# Patient Record
Sex: Male | Born: 1955 | Race: White | Hispanic: No | Marital: Married | Smoking: Never smoker
Health system: Southern US, Community
[De-identification: ages and names within clinical notes are randomized; demographics above are authoritative.]

## PROBLEM LIST (undated history)

## (undated) DIAGNOSIS — Z860100 Personal history of colon polyps, unspecified: Secondary | ICD-10-CM

## (undated) DIAGNOSIS — E119 Type 2 diabetes mellitus without complications: Secondary | ICD-10-CM

## (undated) DIAGNOSIS — I10 Essential (primary) hypertension: Secondary | ICD-10-CM

## (undated) DIAGNOSIS — I4510 Unspecified right bundle-branch block: Secondary | ICD-10-CM

## (undated) DIAGNOSIS — R7989 Other specified abnormal findings of blood chemistry: Secondary | ICD-10-CM

## (undated) DIAGNOSIS — M1990 Unspecified osteoarthritis, unspecified site: Secondary | ICD-10-CM

## (undated) DIAGNOSIS — E785 Hyperlipidemia, unspecified: Secondary | ICD-10-CM

## (undated) DIAGNOSIS — G4730 Sleep apnea, unspecified: Secondary | ICD-10-CM

## (undated) DIAGNOSIS — K5790 Diverticulosis of intestine, part unspecified, without perforation or abscess without bleeding: Secondary | ICD-10-CM

## (undated) DIAGNOSIS — M2550 Pain in unspecified joint: Secondary | ICD-10-CM

## (undated) HISTORY — DX: Other specified abnormal findings of blood chemistry: R79.89

## (undated) HISTORY — PX: COLONOSCOPY: SHX174

## (undated) HISTORY — DX: Type 2 diabetes mellitus without complications: E11.9

## (undated) HISTORY — DX: Unspecified right bundle-branch block: I45.10

---

## 1981-07-16 HISTORY — PX: SHOULDER ARTHROSCOPY: SHX128

## 1998-12-05 ENCOUNTER — Ambulatory Visit (HOSPITAL_COMMUNITY): Admission: RE | Admit: 1998-12-05 | Discharge: 1998-12-05 | Payer: Self-pay | Admitting: Gastroenterology

## 2004-06-19 ENCOUNTER — Ambulatory Visit (HOSPITAL_BASED_OUTPATIENT_CLINIC_OR_DEPARTMENT_OTHER): Admission: RE | Admit: 2004-06-19 | Discharge: 2004-06-19 | Payer: Self-pay | Admitting: Family Medicine

## 2004-08-03 ENCOUNTER — Ambulatory Visit (HOSPITAL_COMMUNITY): Admission: RE | Admit: 2004-08-03 | Discharge: 2004-08-03 | Payer: Self-pay | Admitting: Gastroenterology

## 2012-01-05 ENCOUNTER — Emergency Department (HOSPITAL_COMMUNITY): Payer: BC Managed Care – PPO

## 2012-01-05 ENCOUNTER — Emergency Department (HOSPITAL_COMMUNITY)
Admission: EM | Admit: 2012-01-05 | Discharge: 2012-01-05 | Disposition: A | Discharge status: Home / Self Care | Payer: BC Managed Care – PPO | Attending: Emergency Medicine | Admitting: Emergency Medicine

## 2012-01-05 ENCOUNTER — Encounter (HOSPITAL_COMMUNITY): Payer: Self-pay | Admitting: Physical Medicine and Rehabilitation

## 2012-01-05 DIAGNOSIS — Z7982 Long term (current) use of aspirin: Secondary | ICD-10-CM | POA: Insufficient documentation

## 2012-01-05 DIAGNOSIS — S61209A Unspecified open wound of unspecified finger without damage to nail, initial encounter: Secondary | ICD-10-CM | POA: Insufficient documentation

## 2012-01-05 DIAGNOSIS — S61218A Laceration without foreign body of other finger without damage to nail, initial encounter: Secondary | ICD-10-CM

## 2012-01-05 DIAGNOSIS — W268XXA Contact with other sharp object(s), not elsewhere classified, initial encounter: Secondary | ICD-10-CM | POA: Insufficient documentation

## 2012-01-05 DIAGNOSIS — E785 Hyperlipidemia, unspecified: Secondary | ICD-10-CM | POA: Insufficient documentation

## 2012-01-05 HISTORY — DX: Hyperlipidemia, unspecified: E78.5

## 2012-01-05 MED ORDER — LIDOCAINE HCL 2 % IJ SOLN
INTRAMUSCULAR | Status: AC
  Filled 2012-01-05: qty 1

## 2012-01-05 NOTE — ED Notes (Signed)
Pt presents to department for evaluation of R 1st finger laceration. States he cut his finger on mug this afternoon. 2cm laceration noted, bleeding controlled. States numbness/tingling to tip of finger. No other injuries noted. He is alert and oriented x4. No signs of acute distress noted.

## 2012-01-05 NOTE — Discharge Instructions (Signed)
You were seen and evaluated for your laceration of your finger. Your wound was cleaned and closed with sutures. Given given instructions to followup with orthopedic hand specialist, Dr. Merlyn Lot on Monday. Please call his office to schedule close followup appointment. Keep your wound clean and dry. Return if you develop any persistent bleeding or increased pain.   Laceration Care, Adult A laceration is a cut or lesion that goes through all layers of the skin and into the tissue just beneath the skin. TREATMENT  Some lacerations may not require closure. Some lacerations may not be able to be closed due to an increased risk of infection. It is important to see your caregiver as soon as possible after an injury to minimize the risk of infection and maximize the opportunity for successful closure. If closure is appropriate, pain medicines may be given, if needed. The wound will be cleaned to help prevent infection. Your caregiver will use stitches (sutures), staples, wound glue (adhesive), or skin adhesive strips to repair the laceration. These tools bring the skin edges together to allow for faster healing and a better cosmetic outcome. However, all wounds will heal with a scar. Once the wound has healed, scarring can be minimized by covering the wound with sunscreen during the day for 1 full year. HOME CARE INSTRUCTIONS  For sutures or staples:  Keep the wound clean and dry.   If you were given a bandage (dressing), you should change it at least once a day. Also, change the dressing if it becomes wet or dirty, or as directed by your caregiver.   Wash the wound with soap and water 2 times a day. Rinse the wound off with water to remove all soap. Pat the wound dry with a clean towel.   After cleaning, apply a thin layer of the antibiotic ointment as recommended by your caregiver. This will help prevent infection and keep the dressing from sticking.   You may shower as usual after the first 24 hours. Do  not soak the wound in water until the sutures are removed.   Only take over-the-counter or prescription medicines for pain, discomfort, or fever as directed by your caregiver.   Get your sutures or staples removed as directed by your caregiver.  For skin adhesive strips:  Keep the wound clean and dry.   Do not get the skin adhesive strips wet. You may bathe carefully, using caution to keep the wound dry.   If the wound gets wet, pat it dry with a clean towel.   Skin adhesive strips will fall off on their own. You may trim the strips as the wound heals. Do not remove skin adhesive strips that are still stuck to the wound. They will fall off in time.  For wound adhesive:  You may briefly wet your wound in the shower or bath. Do not soak or scrub the wound. Do not swim. Avoid periods of heavy perspiration until the skin adhesive has fallen off on its own. After showering or bathing, gently pat the wound dry with a clean towel.   Do not apply liquid medicine, cream medicine, or ointment medicine to your wound while the skin adhesive is in place. This may loosen the film before your wound is healed.   If a dressing is placed over the wound, be careful not to apply tape directly over the skin adhesive. This may cause the adhesive to be pulled off before the wound is healed.   Avoid prolonged exposure to sunlight or  tanning lamps while the skin adhesive is in place. Exposure to ultraviolet light in the first year will darken the scar.   The skin adhesive will usually remain in place for 5 to 10 days, then naturally fall off the skin. Do not pick at the adhesive film.  You may need a tetanus shot if:  You cannot remember when you had your last tetanus shot.   You have never had a tetanus shot.  If you get a tetanus shot, your arm may swell, get red, and feel warm to the touch. This is common and not a problem. If you need a tetanus shot and you choose not to have one, there is a rare chance of  getting tetanus. Sickness from tetanus can be serious. SEEK MEDICAL CARE IF:   You have redness, swelling, or increasing pain in the wound.   You see a red line that goes away from the wound.   You have yellowish-white fluid (pus) coming from the wound.   You have a fever.   You notice a bad smell coming from the wound or dressing.   Your wound breaks open before or after sutures have been removed.   You notice something coming out of the wound such as wood or glass.   Your wound is on your hand or foot and you cannot move a finger or toe.  SEEK IMMEDIATE MEDICAL CARE IF:   Your pain is not controlled with prescribed medicine.   You have severe swelling around the wound causing pain and numbness or a change in color in your arm, hand, leg, or foot.   Your wound splits open and starts bleeding.   You have worsening numbness, weakness, or loss of function of any joint around or beyond the wound.   You develop painful lumps near the wound or on the skin anywhere on your body.  MAKE SURE YOU:   Understand these instructions.   Will watch your condition.   Will get help right away if you are not doing well or get worse.  Document Released: 07/02/2005 Document Revised: 06/21/2011 Document Reviewed: 12/26/2010 [facility] Patient Information 2012 [place], [place].   RESOURCE GUIDE  Chronic Pain Problems: Contact [facility]  [phone] Patients need to be referred by their primary care doctor.  Insufficient Money for Medicine: Contact United Way:  call "211" or [facility] [phone].  No Primary Care Doctor: - Call Health Connect  [phone] - can help you locate a primary care doctor that  accepts your insurance, provides certain services, etc. - Physician Referral [phone]  Agencies that provide inexpensive medical care: - [facility]  [phone] - [facility]  [phone] - [facility]  [phone] [facility]  [phone] - [facility]  [phone] [facility]  [phone]  Medicaid-accepting [facility] Providers: - [facility]- [address]  [phone], Mon-Fri 9am-7pm, Sat 9am-1pm - [facility]- [address] [place], [address] [place]  [phone] - [facility]- [address] [place]  [phone] [facility]- [address]  [phone] - Renaye Rakers- [address] [place], [address], [phone]  Only accepts [place] patients after they have their name  applied to their card  Self Pay (no insurance) in [place]: - Sickle Cell Patients: Dr Willey Blade, [facility]  [address] [place],  [phone] - [facility]- [address] [place]  [phone]       Patrcia Dolly [facility] [place]- [address]       -     [facility]- see information above (Speak to [employer] if you do not have insurance)       -  [facility]- [address] [place], [phone]       -  [facility]- [address],  [phone]       -  [facility]- [address], [phone]       -  Dr Julio Sicks-  [address], [place], [phone]       -  [facility]- [address], [phone]       -  [facility]- [address], [phone], also [address], [phone]       -    [facility]- [address] [place], [phone], 1st & 3rd Saturday   every month, 10am-1pm  1) Find a Doctor and Pay Out of Pocket Although you won't have to find out who is covered by your insurance plan, it is a good idea to ask around and get recommendations. You will then need to call the office and see if the doctor you have chosen will accept you as a new patient and what types of options they offer for patients who are self-pay. Some doctors offer discounts or will  set up payment plans for their patients who do not have insurance, but you will need to ask so you aren't surprised when you get to your appointment.  2) Contact Your Local Health Department Not all health departments have doctors that can see patients for sick visits, but many do, so it is worth a call to see if yours does. If you don't know where your local health department is, you can check in your phone book. The CDC also has a tool to help you locate your state's health department, and many state websites also have listings of all of their local health departments.  3) Find a Walk-in Clinic If your illness is not likely to be very severe or complicated, you may want to try a walk in clinic. These are popping up all over the country in pharmacies, drugstores, and shopping centers. They're usually staffed by nurse practitioners or physician assistants that have been trained to treat common illnesses and complaints. They're usually fairly quick and inexpensive. However, if you have serious medical issues or chronic medical problems, these are probably not your best option  STD Testing - [facility] Department of [facility], STD Clinic, [address] [place], phone [phone] or [phone].  Monday - Friday, call for an appointment. [facility] Department of [employer], STD Clinic, [address], [place], phone [phone] or [phone].  Monday - Friday, call for an appointment.  Abuse/Neglect: [facility] Child Abuse Hotline 315-310-6410 [facility] Child Abuse Hotline (334)435-9509 (After Hours)  Emergency Shelter:  [facility] (919)343-4504  Maternity Homes: - [facility] [facility] 601-758-2106 - [facility] [phone]  MRSA Hotline #:   (817)673-3325  [facility] Resources  [facility]  United Way [facility] Dept. [address]  [address]         [address] [place] [address]  Blondell Reveal Phone:  [phone]                                  Phone:  [phone]                   Phone:  [phone]  [facility] Health, [phone] - [phone]       -     [facility] in [place], [address],                                  [phone], [facility] Child Abuse Hotline 9164042372 or [phone] (After Hours)   Behavioral Health Services  Substance Abuse Resources: - [facility] Drug Services  (732)416-4785 - [facility] [phone] - [facility] [phone] Floydene Flock 734-774-2067 - Residential & Outpatient Substance Abuse Program  [phone]  Psychological Services: [facility]  [phone] Services  [phone] - [facility], [phone] [place]. [address] [place], ACCESS LINE: (445) [phone] or [phone], [employer]  Dental Assistance  If unable to pay or uninsured, contact:  [facility] or [facility]. to become qualified for the adult dental clinic.  Patients with Medicaid: [facility] Dept [phone] [address], [phone] [address], [phone]  If unable to pay, or uninsured, contact [facility] [phone]) or [facility] Department [phone] in [place], [phone] in [facility]) to become qualified for the adult dental clinic  Other Low-Cost Community Dental Services: - [facility]- [address] [place], [place], [place], [address], [phone], Ext. 123, 2nd and 4th Thursday of the month at 6:30am.  10 clients each day by appointment, can sometimes see walk-in patients if someone does not show for an appointment. [facility]-  [address] [place], [place], [address], [phone] - [facility]- [address] [place], [place], [address], [phone] - [facility]- [phone] [facility]- [phone] [facility] Department- [phone]

## 2012-01-05 NOTE — ED Provider Notes (Signed)
History     CSN: [id number]  Arrival date & time 01/05/12  4098   First MD Initiated Contact with Patient 01/05/12 2003      Chief Complaint  Patient presents with  . Extremity Laceration    HPI  History provided by the patient. Patient is 56 year old male with history of hyperlipidemia who presents with complaints of laceration to right index finger. Patient reports having a broken porcelain month slice finger. Patient has had persistent bleeding has been difficult to control. Patient also has associated numbness to the tip of the finger. Patient denies any other injury. Patient denies any reduced range of motion or weakness in the finger. Symptoms are described as moderate. Patient is current on tetanus the last several months. Patient is right-hand dominant. They're no other aggravating or alleviating factors.   Past Medical History  Diagnosis Date  . Hyperlipemia     No past surgical history on file.  History reviewed. No pertinent family history.  History  Substance Use Topics  . Smoking status: Never Smoker   . Smokeless tobacco: Not on file  . Alcohol Use: No      Review of Systems  Skin:       Laceration  Neurological: Positive for numbness. Negative for dizziness and weakness.    Allergies  Review of patient's allergies indicates no known allergies.  Home Medications   Current Outpatient Rx  Name Route Sig Dispense Refill  . ASPIRIN EC 81 MG PO TBEC Oral Take 81 mg by mouth daily.    . OMEGA-3 FATTY ACIDS 1000 MG PO CAPS Oral Take 2 g by mouth daily.    . ADULT MULTIVITAMIN W/MINERALS CH Oral Take 1 tablet by mouth daily.    Marland Kitchen ROSUVASTATIN CALCIUM 10 MG PO TABS Oral Take 10 mg by mouth daily.    . TESTOSTERONE 50 MG/5GM TD GEL Transdermal Place 5 g onto the skin daily.      BP 147/86  Pulse 77  Temp 98.7 F (37.1 C) (Oral)  Resp 18  SpO2 98%  Physical Exam  Nursing note and vitals reviewed. Constitutional: He appears well-developed and  well-nourished. No distress.  HENT:  Head: Normocephalic.  Cardiovascular: Normal rate and regular rhythm.   Pulmonary/Chest: Effort normal and breath sounds normal.  Musculoskeletal:       1.5 cm laceration to the ulnar aspect of right index finger near the PIP. Normal range of motion and strength of the finger. Normal cap refill less than 2 seconds. She reports numbness to the lower aspect of the distal tip of finger. There is pulsatile bleeding from the laceration.  Neurological: He is alert.  Skin: Skin is warm.  Psychiatric: He has a normal mood and affect.    ED Course  Procedures  LACERATION REPAIR Performed by: Angus Seller Authorized by: Angus Seller Consent: Verbal consent obtained. Risks and benefits: risks, benefits and alternatives were discussed Consent given by: patient Patient identity confirmed: provided demographic data Prepped and Draped in normal sterile fashion Wound explored  Laceration Location: Medial right index finger  Laceration Length: 1.5 cm  No Foreign Bodies seen or palpated  Anesthesia: local infiltration  Local anesthetic: lidocaine 2 % without epinephrine  Anesthetic total: 5 ml  Irrigation method: syringe Amount of cleaning: standard  Skin closure: Skin with 3-0 Prolene   Number of sutures: 4   Technique: Simple interrupted   Patient tolerance: Patient tolerated the procedure well with no immediate complications.     Dg Finger Index  Right  01/05/2012  *RADIOLOGY REPORT*  Clinical Data: Glass mug shattered on right hand, with laceration at the right index finger.  RIGHT INDEX FINGER 2+V  Comparison: None.  Findings: There is no evidence of osseous disruption.  No radiopaque foreign bodies are identified.  The soft tissues are difficult to fully assess due to the surrounding dressing.  The known soft tissue laceration is not well characterized.  Visualized joint spaces are preserved.  The osseous structures appear intact.   IMPRESSION: No evidence of osseous disruption; no radiopaque foreign bodies seen.  Original Report Authenticated By: Tonia Ghent, M.D.     1. Laceration of finger, index       MDM  Patient seen and evaluated. Patient no acute distress.   Patient seen and discussed with attending physician. Will consult hand surgery.   Spoke with Dr. Merlyn Lot with hand. Bleeding is controlled at this point and he will see patient in followup on Monday. Would like wound closed with sutures.  Angus Seller, [place] 01/06/12 [phone]

## 2012-01-06 NOTE — ED Provider Notes (Signed)
Medical screening examination/treatment/procedure(s) were conducted as a shared visit with non-physician practitioner(s) and myself.  I personally evaluated the patient during the encounter  CR<2secs, witnessed arterial bleeding in ED controlled, suspect digital nerve lac, intact FDP/FDS/ext against resistance 5/5, lac ulnar aspect index near PIP.  Hurman Horn, MD 01/06/12 1247

## 2012-01-07 ENCOUNTER — Encounter (HOSPITAL_BASED_OUTPATIENT_CLINIC_OR_DEPARTMENT_OTHER): Payer: Self-pay | Admitting: *Deleted

## 2012-01-07 ENCOUNTER — Other Ambulatory Visit: Payer: Self-pay | Admitting: Orthopedic Surgery

## 2012-01-08 ENCOUNTER — Encounter (HOSPITAL_BASED_OUTPATIENT_CLINIC_OR_DEPARTMENT_OTHER)
Admission: RE | Disposition: A | Discharge status: Home / Self Care | Payer: Self-pay | Source: Ambulatory Visit | Attending: Orthopedic Surgery

## 2012-01-08 ENCOUNTER — Encounter (HOSPITAL_BASED_OUTPATIENT_CLINIC_OR_DEPARTMENT_OTHER): Payer: Self-pay | Admitting: Orthopedic Surgery

## 2012-01-08 ENCOUNTER — Encounter (HOSPITAL_BASED_OUTPATIENT_CLINIC_OR_DEPARTMENT_OTHER): Payer: Self-pay | Admitting: Anesthesiology

## 2012-01-08 ENCOUNTER — Ambulatory Visit (HOSPITAL_BASED_OUTPATIENT_CLINIC_OR_DEPARTMENT_OTHER)
Admission: RE | Admit: 2012-01-08 | Discharge: 2012-01-08 | Disposition: A | Discharge status: Home / Self Care | Payer: BC Managed Care – PPO | Source: Ambulatory Visit | Attending: Orthopedic Surgery | Admitting: Orthopedic Surgery

## 2012-01-08 ENCOUNTER — Ambulatory Visit (HOSPITAL_BASED_OUTPATIENT_CLINIC_OR_DEPARTMENT_OTHER): Payer: BC Managed Care – PPO | Admitting: Anesthesiology

## 2012-01-08 ENCOUNTER — Encounter (HOSPITAL_BASED_OUTPATIENT_CLINIC_OR_DEPARTMENT_OTHER): Payer: Self-pay | Admitting: Certified Registered"

## 2012-01-08 DIAGNOSIS — S65509A Unspecified injury of blood vessel of unspecified finger, initial encounter: Secondary | ICD-10-CM | POA: Insufficient documentation

## 2012-01-08 DIAGNOSIS — E785 Hyperlipidemia, unspecified: Secondary | ICD-10-CM | POA: Insufficient documentation

## 2012-01-08 DIAGNOSIS — G4730 Sleep apnea, unspecified: Secondary | ICD-10-CM | POA: Insufficient documentation

## 2012-01-08 DIAGNOSIS — W268XXA Contact with other sharp object(s), not elsewhere classified, initial encounter: Secondary | ICD-10-CM | POA: Insufficient documentation

## 2012-01-08 DIAGNOSIS — IMO0002 Reserved for concepts with insufficient information to code with codable children: Secondary | ICD-10-CM | POA: Insufficient documentation

## 2012-01-08 HISTORY — PX: NERVE AND ARTERY REPAIR: SHX5694

## 2012-01-08 SURGERY — NERVE AND ARTERY REPAIR
Anesthesia: General | Site: Finger | Laterality: Right | Wound class: Contaminated

## 2012-01-08 MED ORDER — CEFAZOLIN SODIUM-DEXTROSE 2-3 GM-% IV SOLR
2.0000 g | INTRAVENOUS | Status: AC
  Administered 2012-01-08: 2 g via INTRAVENOUS

## 2012-01-08 MED ORDER — SUCCINYLCHOLINE CHLORIDE 20 MG/ML IJ SOLN
INTRAMUSCULAR | Status: DC | PRN
  Administered 2012-01-08: 120 mg via INTRAVENOUS

## 2012-01-08 MED ORDER — LIDOCAINE HCL (CARDIAC) 20 MG/ML IV SOLN
INTRAVENOUS | Status: DC | PRN
  Administered 2012-01-08: 50 mg via INTRAVENOUS

## 2012-01-08 MED ORDER — CHLORHEXIDINE GLUCONATE 4 % EX LIQD: 60.0000 mL | Freq: Once | CUTANEOUS | Status: DC

## 2012-01-08 MED ORDER — LIDOCAINE HCL (PF) 1 % IJ SOLN: INTRAVENOUS | Status: DC | PRN

## 2012-01-08 MED ORDER — OXYCODONE HCL 5 MG PO TABS: 5.0000 mg | ORAL_TABLET | Freq: Once | ORAL | Status: DC | PRN

## 2012-01-08 MED ORDER — MIDAZOLAM HCL 2 MG/2ML IJ SOLN
0.5000 mg | INTRAMUSCULAR | Status: DC | PRN
  Administered 2012-01-08: 2 mg via INTRAVENOUS

## 2012-01-08 MED ORDER — EPHEDRINE SULFATE 50 MG/ML IJ SOLN
INTRAMUSCULAR | Status: DC | PRN
  Administered 2012-01-08: 10 mg via INTRAVENOUS

## 2012-01-08 MED ORDER — LACTATED RINGERS IV SOLN
INTRAVENOUS | Status: DC
  Administered 2012-01-08 (×2): via INTRAVENOUS

## 2012-01-08 MED ORDER — FENTANYL CITRATE 0.05 MG/ML IJ SOLN
50.0000 ug | INTRAMUSCULAR | Status: DC | PRN
  Administered 2012-01-08: 100 ug via INTRAVENOUS

## 2012-01-08 MED ORDER — METOCLOPRAMIDE HCL 5 MG/ML IJ SOLN: 10.0000 mg | Freq: Once | INTRAMUSCULAR | Status: DC | PRN

## 2012-01-08 MED ORDER — ONDANSETRON HCL 4 MG/2ML IJ SOLN
INTRAMUSCULAR | Status: DC | PRN
  Administered 2012-01-08: 4 mg via INTRAVENOUS

## 2012-01-08 MED ORDER — ROPIVACAINE HCL 5 MG/ML IJ SOLN
INTRAMUSCULAR | Status: DC | PRN
  Administered 2012-01-08: 30 mL via EPIDURAL

## 2012-01-08 MED ORDER — FENTANYL CITRATE 0.05 MG/ML IJ SOLN: 25.0000 ug | INTRAMUSCULAR | Status: DC | PRN

## 2012-01-08 MED ORDER — LIDOCAINE HCL 1 % IJ SOLN
INTRAMUSCULAR | Status: DC | PRN
  Administered 2012-01-08: 2 mL via INTRADERMAL

## 2012-01-08 MED ORDER — METOCLOPRAMIDE HCL 5 MG/ML IJ SOLN
INTRAMUSCULAR | Status: DC | PRN
  Administered 2012-01-08 (×2): 10 mg via INTRAVENOUS

## 2012-01-08 MED ORDER — HYDROCODONE-ACETAMINOPHEN 5-325 MG PO TABS: ORAL_TABLET | ORAL | Status: DC

## 2012-01-08 MED ORDER — DEXAMETHASONE SODIUM PHOSPHATE 4 MG/ML IJ SOLN
INTRAMUSCULAR | Status: DC | PRN
  Administered 2012-01-08: 8 mg via INTRAVENOUS

## 2012-01-08 MED ORDER — CEFAZOLIN SODIUM 1-5 GM-% IV SOLN: 1.0000 g | INTRAVENOUS | Status: DC

## 2012-01-08 MED ORDER — 0.9 % SODIUM CHLORIDE (POUR BTL) OPTIME
TOPICAL | Status: DC | PRN
  Administered 2012-01-08: 1000 mL

## 2012-01-08 MED ORDER — PROPOFOL 10 MG/ML IV EMUL
INTRAVENOUS | Status: DC | PRN
  Administered 2012-01-08: 250 mg via INTRAVENOUS

## 2012-01-08 SURGICAL SUPPLY — 63 items
BAG DECANTER FOR FLEXI CONT (MISCELLANEOUS) ×1 IMPLANT
BANDAGE CONFORM 2  STR LF (GAUZE/BANDAGES/DRESSINGS) ×1 IMPLANT
BANDAGE ELASTIC 3 VELCRO ST LF (GAUZE/BANDAGES/DRESSINGS) ×2 IMPLANT
BANDAGE GAUZE ELAST BULKY 4 IN (GAUZE/BANDAGES/DRESSINGS) ×2 IMPLANT
BLADE MINI RND TIP GREEN BEAV (BLADE) IMPLANT
BLADE SURG 15 STRL LF DISP TIS (BLADE) ×2 IMPLANT
BLADE SURG 15 STRL SS (BLADE) ×4
BNDG CMPR 9X4 STRL LF SNTH (GAUZE/BANDAGES/DRESSINGS) ×1
BNDG COHESIVE 1X5 TAN STRL LF (GAUZE/BANDAGES/DRESSINGS) ×1 IMPLANT
BNDG ESMARK 4X9 LF (GAUZE/BANDAGES/DRESSINGS) ×1 IMPLANT
CHLORAPREP W/TINT 26ML (MISCELLANEOUS) ×2 IMPLANT
CLOTH BEACON ORANGE TIMEOUT ST (SAFETY) ×2 IMPLANT
CORDS BIPOLAR (ELECTRODE) ×2 IMPLANT
COVER MAYO STAND STRL (DRAPES) ×2 IMPLANT
COVER TABLE BACK 60X90 (DRAPES) ×2 IMPLANT
CUFF TOURNIQUET SINGLE 18IN (TOURNIQUET CUFF) IMPLANT
CUFF TOURNIQUET SINGLE 24IN (TOURNIQUET CUFF) ×1 IMPLANT
DECANTER SPIKE VIAL GLASS SM (MISCELLANEOUS) ×1 IMPLANT
DRAPE EXTREMITY T 121X128X90 (DRAPE) ×2 IMPLANT
DRAPE SURG 17X23 STRL (DRAPES) ×2 IMPLANT
GAUZE XEROFORM 1X8 LF (GAUZE/BANDAGES/DRESSINGS) ×2 IMPLANT
GLOVE BIO SURGEON STRL SZ7.5 (GLOVE) ×2 IMPLANT
GLOVE BIOGEL PI IND STRL 8 (GLOVE) ×1 IMPLANT
GLOVE BIOGEL PI INDICATOR 8 (GLOVE) ×1
GLOVE ECLIPSE 6.5 STRL STRAW (GLOVE) ×1 IMPLANT
GOWN BRE IMP PREV XXLGXLNG (GOWN DISPOSABLE) ×3 IMPLANT
GOWN PREVENTION PLUS XLARGE (GOWN DISPOSABLE) ×2 IMPLANT
LOOP VESSEL MAXI BLUE (MISCELLANEOUS) IMPLANT
NDL HYPO 25X1 1.5 SAFETY (NEEDLE) IMPLANT
NDL SAFETY ECLIPSE 18X1.5 (NEEDLE) IMPLANT
NEEDLE HYPO 18GX1.5 SHARP (NEEDLE) ×2
NEEDLE HYPO 25X1 1.5 SAFETY (NEEDLE) IMPLANT
NS IRRIG 1000ML POUR BTL (IV SOLUTION) ×2 IMPLANT
PACK BASIN DAY SURGERY FS (CUSTOM PROCEDURE TRAY) ×2 IMPLANT
PAD CAST 3X4 CTTN HI CHSV (CAST SUPPLIES) ×1 IMPLANT
PAD CAST 4YDX4 CTTN HI CHSV (CAST SUPPLIES) IMPLANT
PADDING CAST ABS 4INX4YD NS (CAST SUPPLIES)
PADDING CAST ABS COTTON 4X4 ST (CAST SUPPLIES) ×1 IMPLANT
PADDING CAST COTTON 3X4 STRL (CAST SUPPLIES) ×2
PADDING CAST COTTON 4X4 STRL (CAST SUPPLIES)
SLEEVE SCD COMPRESS KNEE MED (MISCELLANEOUS) ×1 IMPLANT
SPEAR EYE SURG WECK-CEL (MISCELLANEOUS) ×4 IMPLANT
SPLINT FNGR PLAIN END 5/8X3.25 (CAST SUPPLIES) IMPLANT
SPLINT PLASTALUME 3 1/4 (CAST SUPPLIES) ×2
SPLINT PLASTER CAST XFAST 3X15 (CAST SUPPLIES) IMPLANT
SPLINT PLASTER XTRA FASTSET 3X (CAST SUPPLIES)
SPONGE GAUZE 4X4 12PLY (GAUZE/BANDAGES/DRESSINGS) ×2 IMPLANT
SPONGE SURGIFOAM ABS GEL 12-7 (HEMOSTASIS) ×1 IMPLANT
STOCKINETTE 4X48 STRL (DRAPES) ×2 IMPLANT
SUT ETHIBOND 3-0 V-5 (SUTURE) IMPLANT
SUT ETHILON 4 0 PS 2 18 (SUTURE) ×2 IMPLANT
SUT FIBERWIRE 4-0 18 TAPR NDL (SUTURE)
SUT MERSILENE 6 0 P 1 (SUTURE) IMPLANT
SUT NYLON 9 0 VRM6 (SUTURE) ×1 IMPLANT
SUT PROLENE 6 0 P 1 18 (SUTURE) IMPLANT
SUT SILK 4 0 PS 2 (SUTURE) IMPLANT
SUT VICRYL 4-0 PS2 18IN ABS (SUTURE) IMPLANT
SUTURE FIBERWR 4-0 18 TAPR NDL (SUTURE) IMPLANT
SYR BULB 3OZ (MISCELLANEOUS) ×2 IMPLANT
SYR CONTROL 10ML LL (SYRINGE) IMPLANT
TOWEL OR 17X24 6PK STRL BLUE (TOWEL DISPOSABLE) ×3 IMPLANT
UNDERPAD 30X30 INCONTINENT (UNDERPADS AND DIAPERS) ×2 IMPLANT
WATER STERILE IRR 1000ML POUR (IV SOLUTION) ×1 IMPLANT

## 2012-01-08 NOTE — H&P (Signed)
  James Bullock is an 56 y.o. male.   Chief Complaint: right index finger laceration HPI: 56 yo rhd male lacerated right index finger 01/05/12 on broken coffee mug.  [facility] where wound was cleaned and sutured.  Followed up in office.  Notes numbness at ulnar tip of finger.  Reports no previous injuries to finger and no other injuries at this time.  Past Medical History  Diagnosis Date  . Hyperlipemia     Past Surgical History  Procedure Date  . Shoulder arthroscopy     rt    History reviewed. No pertinent family history. Social History:  reports that he has never smoked. He does not have any smokeless tobacco history on file. He reports that he does not drink alcohol or use illicit drugs.  Allergies: No Known Allergies  No prescriptions prior to admission    No results found for this or any previous visit (from the past 48 hour(s)).  No results found.   A comprehensive review of systems was negative except for: Eyes: positive for contacts/glasses  Height 5\' 11"  (1.803 m), weight 104.327 kg (230 lb).  General appearance: alert, cooperative and appears stated age Head: Normocephalic, without obvious abnormality, atraumatic Neck: supple, symmetrical, trachea midline Resp: clear to auscultation bilaterally Cardio: regular rate and rhythm GI: soft, non-tender; bowel sounds normal; no masses,  no organomegaly Extremities: light touch sensation and capillary refill intact all digits except right index.  +epl/fpl/io.  right index with numbness at ulnar side of pad.  normal sensation on radial side.  brisk capillary refill.  transverse wound at ulnar side of pip joint.  fdp/fds intact.  minimal pain with resisted flexion. Pulses: 2+ and symmetric Skin: as above Neurologic: Grossly normal Incision/Wound: As above  Assessment/Plan Left index finger laceration with ulnar digital nerve and artery lacerations.  Discussed nature of injury with patient.  Recommend operative  exploration with repair of digital nerve and artery lacerations and tendon laceration if present.  Risks, benefits, and alternatives of surgery were discussed and the patient agrees with the plan of care.   James Bullock R 01/08/2012, 9:55 AM

## 2012-01-08 NOTE — Discharge Instructions (Addendum)
Hand Center Instructions °Hand Surgery ° °Wound Care: °Keep your hand elevated above the level of your heart.  Do not allow it to dangle by your side.  Keep the dressing dry and do not remove it unless your doctor advises you to do so.  He will usually change it at the time of your post-op visit.  Moving your fingers is advised to stimulate circulation but will depend on the site of your surgery.  If you have a splint applied, your doctor will advise you regarding movement. ° °Activity: °Do not drive or operate machinery today.  Rest today and then you may return to your normal activity and work as indicated by your physician. ° °Diet:  °Drink liquids today or eat a light diet.  You may resume a regular diet tomorrow.   ° °General expectations: °Pain for two to three days. °Fingers may become slightly swollen. ° °Call your doctor if any of the following occur: °Severe pain not relieved by pain medication. °Elevated temperature. °Dressing soaked with blood. °Inability to move fingers. °White or bluish color to fingers. ° ° ° °Post Anesthesia Home Care Instructions ° °Activity: °Get plenty of rest for the remainder of the day. A responsible adult should stay with you for 24 hours following the procedure.  °For the next 24 hours, DO NOT: °-Drive a car °-Operate machinery °-Drink alcoholic beverages °-Take any medication unless instructed by your physician °-Make any legal decisions or sign important papers. ° °Meals: °Start with liquid foods such as gelatin or soup. Progress to regular foods as tolerated. Avoid greasy, spicy, heavy foods. If nausea and/or vomiting occur, drink only clear liquids until the nausea and/or vomiting subsides. Call your physician if vomiting continues. ° °Special Instructions/Symptoms: °Your throat may feel dry or sore from the anesthesia or the breathing tube placed in your throat during surgery. If this causes discomfort, gargle with warm salt water. The discomfort should disappear within  24 hours. ° ° °Regional Anesthesia Blocks ° °1. Numbness or the inability to move the "blocked" extremity may last from 3-48 hours after placement. The length of time depends on the medication injected and your individual response to the medication. If the numbness is not going away after 48 hours, call your surgeon. ° °2. The extremity that is blocked will need to be protected until the numbness is gone and the  Strength has returned. Because you cannot feel it, you will need to take extra care to avoid injury. Because it may be weak, you may have difficulty moving it or using it. You may not know what position it is in without looking at it while the block is in effect. ° °3. For blocks in the legs and feet, returning to weight bearing and walking needs to be done carefully. You will need to wait until the numbness is entirely gone and the strength has returned. You should be able to move your leg and foot normally before you try and bear weight or walk. You will need someone to be with you when you first try to ensure you do not fall and possibly risk injury. ° °4. Bruising and tenderness at the needle site are common side effects and will resolve in a few days. ° °5. Persistent numbness or new problems with movement should be communicated to the surgeon or the [facility] ([phone])/ [facility] ([phone]). ° ° °Call your surgeon if you experience:  ° °1.  Fever over 101.0. °2.  Inability   to urinate. °3.  Nausea and/or vomiting. °4.  Extreme swelling or bruising at the surgical site. °5.  Continued bleeding from the incision. °6.  Increased pain, redness or drainage from the incision. °7.  Problems related to your pain medication. ° °

## 2012-01-08 NOTE — Anesthesia Procedure Notes (Addendum)
Anesthesia Regional Block:  Supraclavicular block  Pre-Anesthetic Checklist: ,, timeout performed, Correct Patient, Correct Site, Correct Laterality, Correct Procedure, Correct Position, site marked, Risks and benefits discussed,  Surgical consent,  Pre-op evaluation,  At surgeon's request and post-op pain management  Laterality: Right  Prep: chloraprep       Needles:   Needle Type: Other   (Arrow Echogenic)   Needle Length: 9cm  Needle Gauge: 21    Additional Needles:  Procedures: ultrasound guided Supraclavicular block Narrative:  Start time: 01/08/2012 10:36 AM End time: 01/08/2012 10:43 AM Injection made incrementally with aspirations every 5 mL.  Performed by: Personally  Anesthesiologist: C Frederick  Additional Notes: Ultrasound guidance used to: id relevant anatomy, confirm needle position, local anesthetic spread, avoidance of vascular puncture. Picture saved. No complications. Block performed personally by Janetta Hora. Gelene Mink, MD    Supraclavicular block Procedure Name: Intubation Date/Time: 01/08/2012 2:37 PM Performed by: Verlan Friends Pre-anesthesia Checklist: Patient identified, Emergency Drugs available, Suction available, Patient being monitored and Timeout performed Patient Re-evaluated:Patient Re-evaluated prior to inductionOxygen Delivery Method: Circle System Utilized Preoxygenation: Pre-oxygenation with 100% oxygen Intubation Type: IV induction, Rapid sequence and Cricoid Pressure applied Ventilation: Mask ventilation without difficulty Laryngoscope Size: Miller and 3 Grade View: Grade III Tube type: Oral Tube size: 8.0 mm Number of attempts: 1 Airway Equipment and Method: stylet and oral airway Placement Confirmation: positive ETCO2 and breath sounds checked- equal and bilateral (Could not see cords, saw very bottom of area around cords) Secured at: 24 (at teeth) cm Tube secured with: Tape Dental Injury: Teeth and Oropharynx as per  pre-operative assessment  Difficulty Due To: Difficulty was anticipated, Difficult Airway- due to limited oral opening and Difficult Airway- due to anterior larynx Future Recommendations: Recommend- induction with short-acting agent, and alternative techniques readily available Comments: Very difficult to visualize landmarks, pressure over cords was helpful.Cleon Dew is necessary.  Would recommend having glidescope in room.

## 2012-01-08 NOTE — Transfer of Care (Signed)
Immediate Anesthesia Transfer of Care Note  Patient: James Bullock  Procedure(s) Performed: Procedure(s) (LRB): NERVE AND ARTERY REPAIR (Right)  Patient Location: PACU  Anesthesia Type: GA combined with regional for post-op pain  Level of Consciousness: awake, alert , oriented and patient cooperative  Airway & Oxygen Therapy: Patient Spontanous Breathing and Patient connected to face mask oxygen  Post-op Assessment: Report given to PACU RN and Post -op Vital signs reviewed and stable  Post vital signs: Reviewed and stable  Complications: No apparent anesthesia complications

## 2012-01-08 NOTE — Op Note (Addendum)
Dictation [phone]

## 2012-01-08 NOTE — Progress Notes (Signed)
Assisted Dr. Frederick with right, ultrasound guided, supraclavicular block. Side rails up, monitors on throughout procedure. See vital signs in flow sheet. Tolerated Procedure well. 

## 2012-01-08 NOTE — Anesthesia Preprocedure Evaluation (Signed)
Anesthesia Evaluation  Patient identified by MRN, date of birth, ID band Patient awake    Reviewed: Allergy & Precautions, H&P , NPO status , Patient's Chart, lab work & pertinent test results, reviewed documented beta blocker date and time   Airway Mallampati: II TM Distance: >3 FB Neck ROM: full    Dental   Pulmonary sleep apnea and Continuous Positive Airway Pressure Ventilation ,          Cardiovascular negative cardio ROS      Neuro/Psych negative neurological ROS  negative psych ROS   GI/Hepatic negative GI ROS, Neg liver ROS,   Endo/Other  negative endocrine ROS  Renal/GU negative Renal ROS  negative genitourinary   Musculoskeletal   Abdominal   Peds  Hematology negative hematology ROS (+)   Anesthesia Other Findings See surgeon's H&P   Reproductive/Obstetrics negative OB ROS                           Anesthesia Physical Anesthesia Plan  ASA: II  Anesthesia Plan: General   Post-op Pain Management:    Induction: Intravenous  Airway Management Planned: LMA  Additional Equipment:   Intra-op Plan:   Post-operative Plan: Extubation in OR  Informed Consent: I have reviewed the patients History and Physical, chart, labs and discussed the procedure including the risks, benefits and alternatives for the proposed anesthesia with the patient or authorized representative who has indicated his/her understanding and acceptance.   Dental Advisory Given  Plan Discussed with: CRNA and Surgeon  Anesthesia Plan Comments:         Anesthesia Quick Evaluation

## 2012-01-08 NOTE — Anesthesia Postprocedure Evaluation (Signed)
Anesthesia Post Note  Patient: James Bullock  Procedure(s) Performed: Procedure(s) (LRB): NERVE AND ARTERY REPAIR (Right)  Anesthesia type: General  Patient location: PACU  Post pain: Pain level controlled  Post assessment: Patient's Cardiovascular Status Stable  Last Vitals:  Filed Vitals:   01/08/12 1645  BP: 139/80  Pulse: 80  Temp:   Resp: 15    Post vital signs: Reviewed and stable  Level of consciousness: alert  Complications: No apparent anesthesia complications

## 2012-01-09 NOTE — Op Note (Signed)
[name], Bullock              ACCOUNT NO.:  [id number]  MEDICAL RECORD NO.:  [id number]  LOCATION:  TR02C                        FACILITY:  [facility]  PHYSICIAN:  Betha Loa, MD        DATE OF BIRTH:  08/06/1955  DATE OF PROCEDURE:  01/08/2012 DATE OF DISCHARGE:  01/05/2012                              OPERATIVE REPORT   PREOPERATIVE DIAGNOSES:  Right index finger ulnar digital nerve and artery laceration.  POSTOPERATIVE DIAGNOSES:  Right index finger ulnar digital nerve and artery laceration.  PROCEDURE:  Right index finger, repair of ulnar digital nerve and artery under microscope.  SURGEON:  Betha Loa, MD  ASSISTANT:  Cindee Salt, M.D.  ANESTHESIA:  General with regional.  IV FLUIDS:  Per anesthesia flow sheet.  ESTIMATED BLOOD LOSS:  Minimal.  COMPLICATIONS:  None.  SPECIMENS:  None.  TOURNIQUET TIME:  54 minutes.  HISTORY:  James Bullock is a 56 year old male, who 3 days ago lacerated his right index finger on a coffee cup.  He was seen at [facility] Emergency Department, where his wound was irrigated and debrided and sutures were placed.  He will follow up with me in the office.  He noted decreased sensation on the ulnar side of the finger.  He states there was pulsatile bleeding at the time of his injury.  I recommended going to the operating room for irrigation and debridement of the wound and exploration with potential repair of ulnar digital nerve and artery and expiration tendons.  Risks, benefits and alternatives of surgery were discussed including the risk of blood loss, infection, damage to nerves, vessels, tendons, ligaments, bone, fair surgery, need for additional surgery, complications, wound healing, continued pain, continued numbness.  He voiced understanding of these risks and elected to proceed.  OPERATIVE COURSE:  After being identified preoperatively by myself, the Patient & I agreed upon the procedure and site of the procedure.  The surgical  site was marked.  The risks, benefits and alternatives of surgery were reviewed and he wished to proceed.  Surgical consent had been signed.  He was given 1 g of IV Ancef as preoperative antibiotic prophylaxis.  Regional block was performed by anesthesia in preoperative holding.  He was transported to the operating room and placed on the operating room table in supine position with right upper extremity on An arm board.  General anesthesia was induced by the anesthesiologist.  The right upper extremity was prepped and draped in the normal sterile orthopedic fashion.  A surgical pause was performed between surgeons, anesthesia, operating staff, and all were in agreement with the patient, procedure, and site of procedure.  Tourniquet to proximal aspect of the extremity was inflated to 250 mmHg after exsanguination of limb with Esmarch bandage.  The sutures in his wound had been removed.  The wound was extended in a Brunner fashion.  It was carried into subcutaneous tissues by spreading technique.  The ulnar digital nerve was identified and was partially lacerated.  The ulnar digital artery was identified and was 100% lacerated.  The tendons were not injured.  The microscope was brought in.  The wound was debrided.  The distal end of the arterial  laceration was freshened with the straight scissors.  The lumen was cleared out of clot.  Dora Sims solution was used.  A 9-0 nylon suture was used to repair the arterial ends together in an interrupted circumferential fashion.  This apposed the arterial ends well.  Two interrupted sutures were placed in the nerve.  This was adequate to oppose the nerve ends well.  The tourniquet was deflated.  There was some brisk bleeding.  There was no pulsatile bleeding.  The tourniquet was reinflated after being down for a short period of time.  Gelfoam was placed around the artery.  The wound was closed in a horizontal mattress fashion with 4-0 nylon suture.  The  wound was then dressed with sterile Xeroform, 4x4s, and wrapped with a Kling.  A Alumafoam splint was placed dorsally in the finger to keep the PIP joint, slightly flexed.  The splint was wrapped with a Kling and a Coban dressing.  The tourniquet was deflated again with the total of 54 minutes.  The fingertip was pink with brisk capillary refill after deflation of tourniquet.  Operative drapes were broken down.  The patient was awoken from anesthesia safely. He was transferred back to stretcher and taken to PACU in stable condition.  I will see him back in the office in 1 week for postoperative followup.  I will give him Norco 5/325, 1-2 p.o. q.6 hours p.r.n. pain, dispensed #40.     Betha Loa, MD     KK/MEDQ  D:  01/08/2012  T:  01/09/2012  Job:  [id number]

## 2013-12-28 ENCOUNTER — Other Ambulatory Visit: Payer: Self-pay | Admitting: Family Medicine

## 2013-12-28 ENCOUNTER — Ambulatory Visit
Admission: RE | Admit: 2013-12-28 | Discharge: 2013-12-28 | Disposition: A | Discharge status: Home / Self Care | Payer: BC Managed Care – PPO | Source: Ambulatory Visit | Attending: Family Medicine | Admitting: Family Medicine

## 2013-12-28 DIAGNOSIS — M25561 Pain in right knee: Secondary | ICD-10-CM

## 2013-12-28 DIAGNOSIS — M25562 Pain in left knee: Principal | ICD-10-CM

## 2014-08-09 ENCOUNTER — Other Ambulatory Visit: Payer: Self-pay | Admitting: Orthopedic Surgery

## 2014-08-10 ENCOUNTER — Encounter (HOSPITAL_BASED_OUTPATIENT_CLINIC_OR_DEPARTMENT_OTHER): Payer: Self-pay | Admitting: *Deleted

## 2014-08-10 NOTE — H&P (Signed)
James BastosDarrell R Bullock is an 59 y.o. male.    Chief Complaint: Left Knee Pain  HPI: James Bullock is here today for followup on left knee pain.  He was last seen on 07/13/14 at which time he was referred for an MRI.  Today, the patient states that his symptoms have continued.  He did have some temporary relief with the cortisone injection.  He is here today for review the MRI results.  He denies any new injury.  He denies any fevers chills night sweats or other signs of infection.  Past Medical History  Diagnosis Date  . Hyperlipemia     Past Surgical History  Procedure Laterality Date  . Shoulder arthroscopy      rt    No family history on file. Social History:  reports that he has never smoked. He does not have any smokeless tobacco history on file. He reports that he does not drink alcohol or use illicit drugs.  Allergies: No Known Allergies  No prescriptions prior to admission    No results found for this or any previous visit (from the past 48 hour(s)). No results found.  Review of Systems  Constitutional: Negative.   HENT: Negative.   Eyes: Negative.   Respiratory: Negative.   Cardiovascular: Negative.   Gastrointestinal: Negative.   Genitourinary: Negative.   Musculoskeletal: Positive for joint pain.  Skin: Negative.   Neurological: Negative.   Endo/Heme/Allergies: Negative.   Psychiatric/Behavioral: Negative.     There were no vitals taken for this visit. Physical Exam  Constitutional: He is oriented to person, place, and time. He appears well-developed and well-nourished.  HENT:  Head: Normocephalic and atraumatic.  Eyes: Pupils are equal, round, and reactive to light.  Neck: Normal range of motion. Neck supple.  Cardiovascular: Intact distal pulses.   Respiratory: Effort normal.  Musculoskeletal: He exhibits tenderness.  Neurological: He is alert and oriented to person, place, and time.  Skin: Skin is warm and dry.  Psychiatric: He has a normal mood and affect. His  behavior is normal. Judgment and thought content normal.     Assessment/Plan Assess: Left knee medial meniscal tear  Plan: Treatment options are discussed with the patient.  This patient is also discussed with Dr. Turner Danielsowan who reviews the patient's MRI with the patient and discusses treatment options.  Patient has elected to proceed with a left knee arthroscopy.  Benefits risks and potential complications of surgery are discussed with the patient.  The posting slip is completed and the patient is to discuss scheduling with Agustin CreeKathy Blume.  We anticipate seeing the patient back at time of surgical intervention.  Return sooner if needed.  Call with any issues.  James Bullock R 08/10/2014, 8:23 AM

## 2014-08-13 ENCOUNTER — Ambulatory Visit (HOSPITAL_BASED_OUTPATIENT_CLINIC_OR_DEPARTMENT_OTHER): Payer: BLUE CROSS/BLUE SHIELD | Admitting: Anesthesiology

## 2014-08-13 ENCOUNTER — Encounter (HOSPITAL_BASED_OUTPATIENT_CLINIC_OR_DEPARTMENT_OTHER)
Admission: RE | Disposition: A | Discharge status: Home / Self Care | Payer: Self-pay | Source: Ambulatory Visit | Attending: Orthopedic Surgery

## 2014-08-13 ENCOUNTER — Encounter (HOSPITAL_BASED_OUTPATIENT_CLINIC_OR_DEPARTMENT_OTHER): Payer: Self-pay | Admitting: *Deleted

## 2014-08-13 ENCOUNTER — Ambulatory Visit (HOSPITAL_BASED_OUTPATIENT_CLINIC_OR_DEPARTMENT_OTHER)
Admission: RE | Admit: 2014-08-13 | Discharge: 2014-08-13 | Disposition: A | Discharge status: Home / Self Care | Payer: BLUE CROSS/BLUE SHIELD | Source: Ambulatory Visit | Attending: Orthopedic Surgery | Admitting: Orthopedic Surgery

## 2014-08-13 DIAGNOSIS — E785 Hyperlipidemia, unspecified: Secondary | ICD-10-CM | POA: Insufficient documentation

## 2014-08-13 DIAGNOSIS — S83242A Other tear of medial meniscus, current injury, left knee, initial encounter: Secondary | ICD-10-CM | POA: Insufficient documentation

## 2014-08-13 DIAGNOSIS — Y999 Unspecified external cause status: Secondary | ICD-10-CM | POA: Insufficient documentation

## 2014-08-13 DIAGNOSIS — S83241A Other tear of medial meniscus, current injury, right knee, initial encounter: Secondary | ICD-10-CM

## 2014-08-13 DIAGNOSIS — S83282A Other tear of lateral meniscus, current injury, left knee, initial encounter: Secondary | ICD-10-CM | POA: Insufficient documentation

## 2014-08-13 DIAGNOSIS — X58XXXA Exposure to other specified factors, initial encounter: Secondary | ICD-10-CM | POA: Insufficient documentation

## 2014-08-13 DIAGNOSIS — M1990 Unspecified osteoarthritis, unspecified site: Secondary | ICD-10-CM | POA: Diagnosis not present

## 2014-08-13 DIAGNOSIS — G4730 Sleep apnea, unspecified: Secondary | ICD-10-CM | POA: Diagnosis not present

## 2014-08-13 DIAGNOSIS — Y929 Unspecified place or not applicable: Secondary | ICD-10-CM | POA: Insufficient documentation

## 2014-08-13 DIAGNOSIS — Y939 Activity, unspecified: Secondary | ICD-10-CM | POA: Insufficient documentation

## 2014-08-13 HISTORY — DX: Sleep apnea, unspecified: G47.30

## 2014-08-13 HISTORY — DX: Unspecified osteoarthritis, unspecified site: M19.90

## 2014-08-13 HISTORY — PX: KNEE ARTHROSCOPY WITH MEDIAL MENISECTOMY: SHX5651

## 2014-08-13 HISTORY — PX: CHONDROPLASTY: SHX5177

## 2014-08-13 LAB — POCT HEMOGLOBIN-HEMACUE: HEMOGLOBIN: 14.4 g/dL (ref 13.0–17.0)

## 2014-08-13 SURGERY — ARTHROSCOPY, KNEE, WITH MEDIAL MENISCECTOMY
Anesthesia: General | Site: Knee | Laterality: Left

## 2014-08-13 MED ORDER — METOCLOPRAMIDE HCL 5 MG PO TABS: 5.0000 mg | ORAL_TABLET | Freq: Three times a day (TID) | ORAL | Status: DC | PRN

## 2014-08-13 MED ORDER — MIDAZOLAM HCL 2 MG/2ML IJ SOLN: 1.0000 mg | INTRAMUSCULAR | Status: DC | PRN

## 2014-08-13 MED ORDER — DEXTROSE-NACL 5-0.45 % IV SOLN: INTRAVENOUS | Status: DC

## 2014-08-13 MED ORDER — DEXAMETHASONE SODIUM PHOSPHATE 4 MG/ML IJ SOLN
INTRAMUSCULAR | Status: DC | PRN
  Administered 2014-08-13: 10 mg via INTRAVENOUS

## 2014-08-13 MED ORDER — CEFAZOLIN SODIUM-DEXTROSE 2-3 GM-% IV SOLR: 2.0000 g | INTRAVENOUS | Status: DC

## 2014-08-13 MED ORDER — HYDROMORPHONE HCL 1 MG/ML IJ SOLN
0.2500 mg | INTRAMUSCULAR | Status: DC | PRN
  Administered 2014-08-13: 0.5 mg via INTRAVENOUS
  Administered 2014-08-13 (×2): 0.25 mg via INTRAVENOUS
  Administered 2014-08-13: 0.5 mg via INTRAVENOUS

## 2014-08-13 MED ORDER — FENTANYL CITRATE 0.05 MG/ML IJ SOLN
INTRAMUSCULAR | Status: DC | PRN
  Administered 2014-08-13 (×2): 50 ug via INTRAVENOUS

## 2014-08-13 MED ORDER — ONDANSETRON HCL 4 MG PO TABS: 4.0000 mg | ORAL_TABLET | Freq: Four times a day (QID) | ORAL | Status: DC | PRN

## 2014-08-13 MED ORDER — MIDAZOLAM HCL 2 MG/2ML IJ SOLN
INTRAMUSCULAR | Status: AC
  Filled 2014-08-13: qty 2

## 2014-08-13 MED ORDER — HYDROCODONE-ACETAMINOPHEN 5-325 MG PO TABS: 1.0000 | ORAL_TABLET | Freq: Four times a day (QID) | ORAL | Status: DC | PRN

## 2014-08-13 MED ORDER — HYDROMORPHONE HCL 1 MG/ML IJ SOLN
INTRAMUSCULAR | Status: AC
  Filled 2014-08-13: qty 1

## 2014-08-13 MED ORDER — ONDANSETRON HCL 4 MG/2ML IJ SOLN
INTRAMUSCULAR | Status: DC | PRN
  Administered 2014-08-13: 4 mg via INTRAVENOUS

## 2014-08-13 MED ORDER — PROPOFOL 10 MG/ML IV BOLUS
INTRAVENOUS | Status: DC | PRN
  Administered 2014-08-13: 200 mg via INTRAVENOUS

## 2014-08-13 MED ORDER — BUPIVACAINE-EPINEPHRINE (PF) 0.5% -1:200000 IJ SOLN
INTRAMUSCULAR | Status: AC
  Filled 2014-08-13: qty 30

## 2014-08-13 MED ORDER — CHLORHEXIDINE GLUCONATE 4 % EX LIQD: 60.0000 mL | Freq: Once | CUTANEOUS | Status: DC

## 2014-08-13 MED ORDER — FENTANYL CITRATE 0.05 MG/ML IJ SOLN: 50.0000 ug | INTRAMUSCULAR | Status: DC | PRN

## 2014-08-13 MED ORDER — OXYCODONE HCL 5 MG/5ML PO SOLN
5.0000 mg | Freq: Once | ORAL | Status: AC | PRN
Start: 2014-08-13 — End: 2014-08-13

## 2014-08-13 MED ORDER — MIDAZOLAM HCL 2 MG/ML PO SYRP: 12.0000 mg | ORAL_SOLUTION | Freq: Once | ORAL | Status: DC | PRN

## 2014-08-13 MED ORDER — METOCLOPRAMIDE HCL 5 MG/ML IJ SOLN: 5.0000 mg | Freq: Three times a day (TID) | INTRAMUSCULAR | Status: DC | PRN

## 2014-08-13 MED ORDER — PROPOFOL 10 MG/ML IV BOLUS: INTRAVENOUS | Status: DC | PRN

## 2014-08-13 MED ORDER — PROMETHAZINE HCL 25 MG/ML IJ SOLN: 6.2500 mg | INTRAMUSCULAR | Status: DC | PRN

## 2014-08-13 MED ORDER — ONDANSETRON HCL 4 MG/2ML IJ SOLN: 4.0000 mg | Freq: Four times a day (QID) | INTRAMUSCULAR | Status: DC | PRN

## 2014-08-13 MED ORDER — LIDOCAINE HCL (CARDIAC) 20 MG/ML IV SOLN
INTRAVENOUS | Status: DC | PRN
  Administered 2014-08-13: 50 mg via INTRAVENOUS

## 2014-08-13 MED ORDER — MIDAZOLAM HCL 5 MG/5ML IJ SOLN
INTRAMUSCULAR | Status: DC | PRN
  Administered 2014-08-13: 2 mg via INTRAVENOUS

## 2014-08-13 MED ORDER — OXYCODONE HCL 5 MG PO TABS
ORAL_TABLET | ORAL | Status: AC
  Filled 2014-08-13: qty 1

## 2014-08-13 MED ORDER — OXYCODONE HCL 5 MG PO TABS
5.0000 mg | ORAL_TABLET | Freq: Once | ORAL | Status: AC | PRN
  Administered 2014-08-13: 5 mg via ORAL

## 2014-08-13 MED ORDER — FENTANYL CITRATE 0.05 MG/ML IJ SOLN
INTRAMUSCULAR | Status: AC
  Filled 2014-08-13: qty 6

## 2014-08-13 MED ORDER — LACTATED RINGERS IV SOLN
INTRAVENOUS | Status: DC
  Administered 2014-08-13: 07:00:00 via INTRAVENOUS

## 2014-08-13 MED ORDER — EPINEPHRINE HCL 1 MG/ML IJ SOLN
INTRAMUSCULAR | Status: AC
  Filled 2014-08-13: qty 1

## 2014-08-13 MED ORDER — BUPIVACAINE HCL (PF) 0.5 % IJ SOLN
INTRAMUSCULAR | Status: AC
  Filled 2014-08-13: qty 30

## 2014-08-13 MED ORDER — CEFAZOLIN SODIUM-DEXTROSE 2-3 GM-% IV SOLR
INTRAVENOUS | Status: DC | PRN
  Administered 2014-08-13: 2 g via INTRAVENOUS

## 2014-08-13 SURGICAL SUPPLY — 41 items
BANDAGE ELASTIC 6 VELCRO ST LF (GAUZE/BANDAGES/DRESSINGS) ×3 IMPLANT
BLADE 4.2CUDA (BLADE) IMPLANT
BLADE CUTTER GATOR 3.5 (BLADE) IMPLANT
BLADE GREAT WHITE 4.2 (BLADE) IMPLANT
BLADE GREAT WHITE 4.2MM (BLADE)
BNDG COHESIVE 6X5 TAN STRL LF (GAUZE/BANDAGES/DRESSINGS) ×3 IMPLANT
CANISTER SUCT 3000ML (MISCELLANEOUS) IMPLANT
DRAPE ARTHROSCOPY W/POUCH 114 (DRAPES) ×3 IMPLANT
DURAPREP 26ML APPLICATOR (WOUND CARE) ×3 IMPLANT
ELECT MENISCUS 165MM 90D (ELECTRODE) IMPLANT
ELECT REM PT RETURN 9FT ADLT (ELECTROSURGICAL)
ELECTRODE REM PT RTRN 9FT ADLT (ELECTROSURGICAL) IMPLANT
GAUZE SPONGE 4X4 12PLY STRL (GAUZE/BANDAGES/DRESSINGS) ×3 IMPLANT
GAUZE XEROFORM 1X8 LF (GAUZE/BANDAGES/DRESSINGS) ×3 IMPLANT
GLOVE BIO SURGEON STRL SZ7.5 (GLOVE) ×3 IMPLANT
GLOVE BIO SURGEON STRL SZ8.5 (GLOVE) ×3 IMPLANT
GLOVE BIOGEL PI IND STRL 8 (GLOVE) ×1 IMPLANT
GLOVE BIOGEL PI IND STRL 9 (GLOVE) ×1 IMPLANT
GLOVE BIOGEL PI INDICATOR 8 (GLOVE) ×2
GLOVE BIOGEL PI INDICATOR 9 (GLOVE) ×2
GOWN STRL REUS W/ TWL LRG LVL3 (GOWN DISPOSABLE) ×1 IMPLANT
GOWN STRL REUS W/ TWL XL LVL3 (GOWN DISPOSABLE) ×2 IMPLANT
GOWN STRL REUS W/TWL LRG LVL3 (GOWN DISPOSABLE) ×3
GOWN STRL REUS W/TWL XL LVL3 (GOWN DISPOSABLE) ×6
IV NS IRRIG 3000ML ARTHROMATIC (IV SOLUTION) ×3 IMPLANT
KNEE WRAP E Z 3 GEL PACK (MISCELLANEOUS) ×3 IMPLANT
MANIFOLD NEPTUNE II (INSTRUMENTS) IMPLANT
NDL SAFETY ECLIPSE 18X1.5 (NEEDLE) ×1 IMPLANT
NEEDLE HYPO 18GX1.5 SHARP (NEEDLE) ×3
PACK ARTHROSCOPY DSU (CUSTOM PROCEDURE TRAY) ×3 IMPLANT
PACK BASIN DAY SURGERY FS (CUSTOM PROCEDURE TRAY) ×3 IMPLANT
PAD ALCOHOL SWAB (MISCELLANEOUS) ×3 IMPLANT
PENCIL BUTTON HOLSTER BLD 10FT (ELECTRODE) IMPLANT
SET ARTHROSCOPY TUBING (MISCELLANEOUS) ×3
SET ARTHROSCOPY TUBING LN (MISCELLANEOUS) ×1 IMPLANT
SLEEVE SCD COMPRESS KNEE MED (MISCELLANEOUS) IMPLANT
SYR 3ML 18GX1 1/2 (SYRINGE) IMPLANT
SYR 5ML LL (SYRINGE) ×3 IMPLANT
TOWEL OR 17X24 6PK STRL BLUE (TOWEL DISPOSABLE) ×3 IMPLANT
WAND STAR VAC 90 (SURGICAL WAND) IMPLANT
WATER STERILE IRR 1000ML POUR (IV SOLUTION) ×3 IMPLANT

## 2014-08-13 NOTE — Transfer of Care (Signed)
Immediate Anesthesia Transfer of Care Note  Patient: James Bullock  Procedure(s) Performed: Procedure(s): LEFT ARTHROSCOPY KNEE PARTIAL MEDIAL MENISECTOMY CHONDDROPLASTY (Left)  Patient Location: PACU  Anesthesia Type:General  Level of Consciousness: awake and patient cooperative  Airway & Oxygen Therapy: Patient Spontanous Breathing and Patient connected to face mask oxygen  Post-op Assessment: Report given to RN and Post -op Vital signs reviewed and stable  Post vital signs: Reviewed and stable  Last Vitals:  Filed Vitals:   08/13/14 0617  BP: 160/87  Pulse: 91  Temp: 36.8 C  Resp: 20    Complications: No apparent anesthesia complications

## 2014-08-13 NOTE — Op Note (Signed)
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  Surgeon: Feliberto GottronFrank J. Turner Danielsowan M.D.  Assist: Tomi LikensEric K. Gaylene BrooksPhillips PA-C  (present throughout entire procedure and necessary for timely completion of the procedure) Anes: General LMA  EBL: Minimal  Fluids: 800 cc   Indications: Catching popping and pain in the left knee. MRI scan shows parrot-beak tear of medial meniscus as well as some chondromalacia of the medial compartment.. Pt has failed conservative treatment with anti-inflammatory medicines, physical therapy, and modified activites but did get good temporarily from an intra-articular cortisone injection. Pain has recurred and patient desires elective arthroscopic evaluation and treatment of knee. Risks and benefits of surgery have been discussed and questions answered.  Procedure: Patient identified by arm band and taken to the operating room at the day surgery Center. The appropriate anesthetic monitors were attached, and General LMA anesthesia was induced without difficulty. Lateral post was applied to the table and the lower extremity was prepped and draped in usual sterile fashion from the ankle to the midthigh. Time out procedure was performed. We began the operation by making standard inferior lateral and inferior medial peripatellar portals with a #11 blade allowing introduction of the arthroscope through the inferior lateral portal and the out flow to the inferior medial portal. Pump pressure was set at 100 mmHg and diagnostic arthroscopy  revealed a large parrot-beak tear of the medial meniscus that was  debrided back to a stable margin with a small straight biter a 3.5 mm Gator sucker shaver and a 4.2 mm gray-white sucker shaver. The patient also had chondromalacia globally grade 3 to the medial femoral condyle and medial tibial plateau with areas of grade 4 chondromalacia that underwent abrasion arthroplasty to stimulate growth. The anterior cruciate ligament and PCL were noted to be intact. On the lateral side we identified degenerative tearing of the inner rim of the lateral meniscus that was likewise debrided back to a stable margin. There is also grade 2-3 chondromalacia the patellofemoral joint it was lightly debrided with a 35 mm Gator sucker shaver. The gutters were cleared medially and laterally we did identify and remove some small chondral fragments throughout the procedure, the donor site was probably the medial femoral condyle and medial tibial plateau.. The knee was irrigated out normal saline solution. A dressing of xerofoam 4 x 4 dressing sponges, web roll and an Ace wrap was applied. The patient was awakened extubated and taken to the recovery without difficulty.    Signed: Nestor LewandowskyFrank J Soila Printup, MD

## 2014-08-13 NOTE — Anesthesia Preprocedure Evaluation (Addendum)
Anesthesia Evaluation  Patient identified by MRN, date of birth, ID band Patient awake    Reviewed: Allergy & Precautions, H&P , NPO status , Patient's Chart, lab work & pertinent test results  Airway Mallampati: II  TM Distance: >3 FB Neck ROM: full    Dental  (+) Teeth Intact, Dental Advidsory Given   Pulmonary sleep apnea ,    Pulmonary exam normal       Cardiovascular negative cardio ROS      Neuro/Psych negative neurological ROS  negative psych ROS   GI/Hepatic negative GI ROS, Neg liver ROS,   Endo/Other  negative endocrine ROS  Renal/GU negative Renal ROS     Musculoskeletal  (+) Arthritis -,   Abdominal Normal abdominal exam  (+)   Peds  Hematology   Anesthesia Other Findings   Reproductive/Obstetrics negative OB ROS                           Anesthesia Physical Anesthesia Plan  ASA: II  Anesthesia Plan: General LMA   Post-op Pain Management:    Induction:   Airway Management Planned:   Additional Equipment:   Intra-op Plan:   Post-operative Plan:   Informed Consent: I have reviewed the patients History and Physical, chart, labs and discussed the procedure including the risks, benefits and alternatives for the proposed anesthesia with the patient or authorized representative who has indicated his/her understanding and acceptance.   Dental Advisory Given  Plan Discussed with: Anesthesiologist, CRNA and Surgeon  Anesthesia Plan Comments:        Anesthesia Quick Evaluation

## 2014-08-13 NOTE — Anesthesia Postprocedure Evaluation (Signed)
Anesthesia Post Note  Patient: James Bullock  Procedure(s) Performed: Procedure(s) (LRB): LEFT ARTHROSCOPY KNEE PARTIAL MEDIAL MENISECTOMY CHONDDROPLASTY (Left)  Anesthesia type: general  Patient location: PACU  Post pain: Pain level controlled  Post assessment: Patient's Cardiovascular Status Stable  Last Vitals:  Filed Vitals:   08/13/14 0930  BP:   Pulse: 88  Temp:   Resp: 18    Post vital signs: Reviewed and stable  Level of consciousness: sedated  Complications: No apparent anesthesia complications

## 2014-08-13 NOTE — Discharge Instructions (Signed)
Arthroscopic Procedure, Knee °An arthroscopic procedure can find what is wrong with your knee. °PROCEDURE °Arthroscopy is a surgical technique that allows your orthopedic surgeon to diagnose and treat your knee injury with accuracy. They will look into your knee through a small instrument. This is almost like a small (pencil sized) telescope. Because arthroscopy affects your knee less than open knee surgery, you can anticipate a more rapid recovery. Taking an active role by following your caregiver's instructions will help with rapid and complete recovery. Use crutches, rest, elevation, ice, and knee exercises as instructed. The length of recovery depends on various factors including type of injury, age, physical condition, medical conditions, and your rehabilitation. °Your knee is the joint between the large bones (femur and tibia) in your leg. Cartilage covers these bone ends which are smooth and slippery and allow your knee to bend and move smoothly. Two menisci, thick, semi-lunar shaped pads of cartilage which form a rim inside the joint, help absorb shock and stabilize your knee. Ligaments bind the bones together and support your knee joint. Muscles move the joint, help support your knee, and take stress off the joint itself. Because of this all programs and physical therapy to rehabilitate an injured or repaired knee require rebuilding and strengthening your muscles. °AFTER THE PROCEDURE °· After the procedure, you will be moved to a recovery area until most of the effects of the medication have worn off. Your caregiver will discuss the test results with you. °· Only take over-the-counter or prescription medicines for pain, discomfort, or fever as directed by your caregiver. °SEEK MEDICAL CARE IF:  °· You have increased bleeding from your wounds. °· You see redness, swelling, or have increasing pain in your wounds. °· You have pus coming from your wound. °· You have an oral temperature above 102° F (38.9°  C). °· You notice a bad smell coming from the wound or dressing. °· You have severe pain with any motion of your knee. °SEEK IMMEDIATE MEDICAL CARE IF:  °· You develop a rash. °· You have difficulty breathing. °· You have any allergic problems. °Document Released: 06/29/2000 Document Revised: 09/24/2011 Document Reviewed: 01/21/2008 °[employer]® Patient Information ©2015 [employer]. This information is not intended to replace advice given to you by your health care provider. Make sure you discuss any questions you have with your health care provider. ° °Post Anesthesia Home Care Instructions ° °Activity: °Get plenty of rest for the remainder of the day. A responsible adult should stay with you for 24 hours following the procedure.  °For the next 24 hours, DO NOT: °-Drive a car °-Operate machinery °-Drink alcoholic beverages °-Take any medication unless instructed by your physician °-Make any legal decisions or sign important papers. ° °Meals: °Start with liquid foods such as gelatin or soup. Progress to regular foods as tolerated. Avoid greasy, spicy, heavy foods. If nausea and/or vomiting occur, drink only clear liquids until the nausea and/or vomiting subsides. Call your physician if vomiting continues. ° °Special Instructions/Symptoms: °Your throat may feel dry or sore from the anesthesia or the breathing tube placed in your throat during surgery. If this causes discomfort, gargle with warm salt water. The discomfort should disappear within 24 hours. ° °

## 2014-08-13 NOTE — Anesthesia Procedure Notes (Signed)
Procedure Name: LMA Insertion Date/Time: 08/13/2014 7:32 AM Performed by: Genevieve NorlanderLINKA, Jamiria Langill L Pre-anesthesia Checklist: Patient identified, Emergency Drugs available, Suction available, Patient being monitored and Timeout performed Patient Re-evaluated:Patient Re-evaluated prior to inductionOxygen Delivery Method: Circle System Utilized Preoxygenation: Pre-oxygenation with 100% oxygen Intubation Type: IV induction Ventilation: Mask ventilation without difficulty LMA: LMA inserted LMA Size: 5.0 Number of attempts: 1 Airway Equipment and Method: Bite block Placement Confirmation: positive ETCO2 Tube secured with: Tape Dental Injury: Teeth and Oropharynx as per pre-operative assessment

## 2014-08-13 NOTE — Interval H&P Note (Signed)
History and Physical Interval Note:  08/13/2014 7:20 AM  James Bullock  has presented today for surgery, with the diagnosis of LEFT MEDIAL MENISCUS TEAR  The various methods of treatment have been discussed with the patient and family. After consideration of risks, benefits and other options for treatment, the patient has consented to  Procedure(s): LEFT ARTHROSCOPY KNEE (Left) as a surgical intervention .  The patient's history has been reviewed, patient examined, no change in status, stable for surgery.  I have reviewed the patient's chart and labs.  Questions were answered to the patient's satisfaction.     Nestor LewandowskyOWAN,Halle Davlin J

## 2014-08-16 ENCOUNTER — Encounter (HOSPITAL_BASED_OUTPATIENT_CLINIC_OR_DEPARTMENT_OTHER): Payer: Self-pay | Admitting: Orthopedic Surgery

## 2014-08-19 ENCOUNTER — Encounter (HOSPITAL_BASED_OUTPATIENT_CLINIC_OR_DEPARTMENT_OTHER): Payer: Self-pay | Admitting: Orthopedic Surgery

## 2015-04-08 ENCOUNTER — Other Ambulatory Visit: Payer: Self-pay | Admitting: Orthopedic Surgery

## 2015-05-05 NOTE — Pre-Procedure Instructions (Signed)
    James Bullock  05/05/2015      [facility] - [place], [place] - [address] [place] [address] Phone: [phone] Fax: [phone]    Your procedure is scheduled on Thursday, November 2.  [facility] Admitting at 10:45 A.M.   Call this number if you have problems the morning of surgery:[phone]                For any other questions, please call [phone], Monday - Friday 8 AM - 4 PM.   Remember:  Do not eat food or drink liquids after midnight Wednesday, November 1.  Take these medicines the morning of surgery with A SIP OF WATER :  May take acetaminophen (TYLENOL) if needed.               Thursday, October 27, STOP taking Vitamins, Herbal Products (Omega-3 Fatty Acids (FISH OIL), NSAIDS (meloxicam -MOBIC).  Do not take any Ibuprofen (Advil), Naproxen (Aleve).               Stop Aspirin as instructed by Dr Turner Danielsowan.   Do not wear jewelry, make-up or nail polish.   Do not wear lotions, powders, or perfumes.    Men may shave face and neck.   Do not bring valuables to the hospital.   [facility] is not responsible for any belongings or valuables.  Contacts, dentures or bridgework may not be worn into surgery.  Leave your suitcase in the car.  After surgery it may be brought to your room.  For patients admitted to the hospital, discharge time will be determined by your treatment team.  Special instructions:  -  Please read over the following fact sheets that you were given. Pain Booklet, Coughing and Deep Breathing, Blood Transfusion Information and Surgical Site Infection Prevention

## 2015-05-06 ENCOUNTER — Encounter (HOSPITAL_COMMUNITY)
Admission: RE | Admit: 2015-05-06 | Discharge: 2015-05-06 | Disposition: A | Discharge status: Home / Self Care | Payer: BLUE CROSS/BLUE SHIELD | Source: Ambulatory Visit | Attending: Orthopedic Surgery | Admitting: Orthopedic Surgery

## 2015-05-06 ENCOUNTER — Telehealth: Payer: Self-pay | Admitting: Cardiovascular Disease

## 2015-05-06 ENCOUNTER — Encounter (HOSPITAL_COMMUNITY): Payer: Self-pay

## 2015-05-06 ENCOUNTER — Ambulatory Visit (HOSPITAL_COMMUNITY)
Admission: RE | Admit: 2015-05-06 | Discharge: 2015-05-06 | Disposition: A | Discharge status: Home / Self Care | Payer: BLUE CROSS/BLUE SHIELD | Source: Ambulatory Visit | Attending: Orthopedic Surgery | Admitting: Orthopedic Surgery

## 2015-05-06 DIAGNOSIS — E785 Hyperlipidemia, unspecified: Secondary | ICD-10-CM | POA: Diagnosis not present

## 2015-05-06 DIAGNOSIS — Z01818 Encounter for other preprocedural examination: Secondary | ICD-10-CM | POA: Diagnosis present

## 2015-05-06 DIAGNOSIS — I10 Essential (primary) hypertension: Secondary | ICD-10-CM | POA: Diagnosis not present

## 2015-05-06 DIAGNOSIS — Z01812 Encounter for preprocedural laboratory examination: Secondary | ICD-10-CM | POA: Insufficient documentation

## 2015-05-06 DIAGNOSIS — M179 Osteoarthritis of knee, unspecified: Secondary | ICD-10-CM | POA: Insufficient documentation

## 2015-05-06 DIAGNOSIS — G4733 Obstructive sleep apnea (adult) (pediatric): Secondary | ICD-10-CM | POA: Diagnosis not present

## 2015-05-06 DIAGNOSIS — Z01810 Encounter for preprocedural cardiovascular examination: Secondary | ICD-10-CM | POA: Insufficient documentation

## 2015-05-06 HISTORY — DX: Pain in unspecified joint: M25.50

## 2015-05-06 HISTORY — DX: Personal history of colon polyps, unspecified: Z86.0100

## 2015-05-06 HISTORY — DX: Diverticulosis of intestine, part unspecified, without perforation or abscess without bleeding: K57.90

## 2015-05-06 HISTORY — DX: Essential (primary) hypertension: I10

## 2015-05-06 HISTORY — DX: Personal history of colonic polyps: Z86.010

## 2015-05-06 LAB — BASIC METABOLIC PANEL
ANION GAP: 7 (ref 5–15)
BUN: 16 mg/dL (ref 6–20)
CO2: 25 mmol/L (ref 22–32)
Calcium: 9.6 mg/dL (ref 8.9–10.3)
Chloride: 108 mmol/L (ref 101–111)
Creatinine, Ser: 1.06 mg/dL (ref 0.61–1.24)
GFR calc Af Amer: >60 mL/min (ref >60)
GLUCOSE: 117 mg/dL — AB (ref 65–99)
POTASSIUM: 4.4 mmol/L (ref 3.5–5.1)
Sodium: 140 mmol/L (ref 135–145)

## 2015-05-06 LAB — CBC WITH DIFFERENTIAL/PLATELET
BASOS ABS: 0.1 10*3/uL (ref 0.0–0.1)
Basophils Relative: 1 %
EOS PCT: 5 %
Eosinophils Absolute: 0.3 10*3/uL (ref 0.0–0.7)
HEMATOCRIT: 43.2 % (ref 39.0–52.0)
Hemoglobin: 15.1 g/dL (ref 13.0–17.0)
LYMPHS ABS: 1.5 10*3/uL (ref 0.7–4.0)
LYMPHS PCT: 25 %
MCH: 32.4 pg (ref 26.0–34.0)
MCHC: 35 g/dL (ref 30.0–36.0)
MCV: 92.7 fL (ref 78.0–100.0)
MONO ABS: 0.5 10*3/uL (ref 0.1–1.0)
MONOS PCT: 9 %
NEUTROS ABS: 3.5 10*3/uL (ref 1.7–7.7)
Neutrophils Relative %: 60 %
PLATELETS: 200 10*3/uL (ref 150–400)
RBC: 4.66 MIL/uL (ref 4.22–5.81)
RDW: 12.1 % (ref 11.5–15.5)
WBC: 5.8 10*3/uL (ref 4.0–10.5)

## 2015-05-06 LAB — TYPE AND SCREEN
ABO/RH(D): O POS
Antibody Screen: NEGATIVE

## 2015-05-06 LAB — URINALYSIS, ROUTINE W REFLEX MICROSCOPIC
Bilirubin Urine: NEGATIVE
Glucose, UA: NEGATIVE mg/dL
HGB URINE DIPSTICK: NEGATIVE
Ketones, ur: NEGATIVE mg/dL
Leukocytes, UA: NEGATIVE
NITRITE: NEGATIVE
PROTEIN: NEGATIVE mg/dL
SPECIFIC GRAVITY, URINE: 1.023 (ref 1.005–1.030)
UROBILINOGEN UA: 0.2 mg/dL (ref 0.0–1.0)
pH: 6.5 (ref 5.0–8.0)

## 2015-05-06 LAB — SURGICAL PCR SCREEN
MRSA, PCR: NEGATIVE
STAPHYLOCOCCUS AUREUS: NEGATIVE

## 2015-05-06 LAB — PROTIME-INR
INR: 1.02 (ref 0.00–1.49)
Prothrombin Time: 13.6 seconds (ref 11.6–15.2)

## 2015-05-06 LAB — APTT: APTT: 29 s (ref 24–37)

## 2015-05-06 LAB — ABO/RH: ABO/RH(D): O POS

## 2015-05-06 MED ORDER — CHLORHEXIDINE GLUCONATE 4 % EX LIQD: 60.0000 mL | Freq: Once | CUTANEOUS | Status: DC

## 2015-05-06 NOTE — Progress Notes (Signed)
Cardiologist  Medical Md  EKG  CXR  Echo  Stress test  Heart cath

## 2015-05-06 NOTE — Progress Notes (Addendum)
Cardiologist-denies having one  Medical Md is Dr.David Azucena CecilSwayne  Denies ever having an echo/stress test/heart cath  Denies EKG or CXR in past yr

## 2015-05-06 NOTE — Pre-Procedure Instructions (Signed)
James Bullock  05/06/2015      [facility] - [place], [place] - [address] [place] [address] Phone: [phone] Fax: [phone]    Your procedure is scheduled on Wed, Nov 2 @ 12:45 PM  [facility] Ad[facility] at 10:45 AM  Call this number if you have problems the morning of surgery:  [phone]   Remember:  Do not eat food or drink liquids after midnight.               Stop taking your Aspirin,Fish Oil,Meloxicam,Vitamins,or Herbal Medications. No Goody's,BC's,Aleve.   Do not wear jewelry.  Do not wear lotions, powders, or colognes.  You may wear deodorant.  Men may shave face and neck.  Do not bring valuables to the hospital.  [facility] is not responsible for any belongings or valuables.  Contacts, dentures or bridgework may not be worn into surgery.  Leave your suitcase in the car.  After surgery it may be brought to your room.  For patients admitted to the hospital, discharge time will be determined by your treatment team.  Patients discharged the day of surgery will not be allowed to drive home.    Special instructions:  [place] - Preparing for Surgery  Before surgery, you can play an important role.  Because skin is not sterile, your skin needs to be as free of germs as possible.  You can reduce the number of germs on you skin by washing with CHG (chlorahexidine gluconate) soap before surgery.  CHG is an antiseptic cleaner which kills germs and bonds with the skin to continue killing germs even after washing.  Please DO NOT use if you have an allergy to CHG or antibacterial soaps.  If your skin becomes reddened/irritated stop using the CHG and inform your nurse when you arrive at Short Stay.  Do not shave (including legs and underarms) for at least 48 hours prior to the first CHG shower.  You may shave your face.  Please follow these instructions carefully:   1.  Shower  with CHG Soap the night before surgery and the                                morning of Surgery.  2.  If you choose to wash your hair, wash your hair first as usual with your       normal shampoo.  3.  After you shampoo, rinse your hair and body thoroughly to remove the                      Shampoo.  4.  Use CHG as you would any other liquid soap.  You can apply chg directly       to the skin and wash gently with scrungie or a clean washcloth.  5.  Apply the CHG Soap to your body ONLY FROM THE NECK DOWN.        Do not use on open wounds or open sores.  Avoid contact with your eyes,       ears, mouth and genitals (private parts).  Wash genitals (private parts)       with your normal soap.  6.  Wash thoroughly, paying special attention to the area where your surgery        will be performed.  7.  Thoroughly rinse your body with  warm water from the neck down.  8.  DO NOT shower/wash with your normal soap after using and rinsing off       the CHG Soap.  9.  Pat yourself dry with a clean towel.            10.  Wear clean pajamas.            11.  Place clean sheets on your bed the night of your first shower and do not        sleep with pets.  Day of Surgery  Do not apply any lotions/deoderants the morning of surgery.  Please wear clean clothes to the hospital/surgery center.    Please read over the following fact sheets that you were given. Pain Booklet, Coughing and Deep Breathing, Blood Transfusion Information, MRSA Information and Surgical Site Infection Prevention

## 2015-05-06 NOTE — Telephone Encounter (Signed)
05/06/2015 Received referral [facility] on patient from [facility] for upcoming appointment with Dr. Allyson SabalBerry on 05/10/2015.  [facility]

## 2015-05-09 NOTE — Progress Notes (Addendum)
Anesthesia Chart Review:  Pt is 59 year old male scheduled for L total knee arthroplasty on 05/18/2015 with Dr. Turner Danielsowan.   PMH includes: HTN, hyperlipidemia, OSA. Never smoker. BMI 32. S/p L knee arthroscopy 08/13/14. S/p R finger nerve and artery repair 01/08/12.   Medications include: ASA, losartan, crestor.   Preoperative labs reviewed.    Chest x-ray 05/06/2015 reviewed. No active cardiopulmonary disease.   EKG 05/06/2015: NSR.   Pt has appt with Dr. Allyson SabalBerry for cardiology clearance 05/10/15. Will revisit chart after that appt.   Rica Mastngela Kabbe, FNP-BC [facility]/Anesthesiology Phone: [phone] 05/09/2015 3:59 PM  Addendum: Patient seen by Dr. Allyson SabalBerry yesterday and felt to be low CV risk for surgery.  Velna Ochsllison Zelenak, PA-C [facility]Anesthesiology Phone [phone] 05/11/2015 9:24 AM

## 2015-05-10 ENCOUNTER — Encounter: Payer: Self-pay | Admitting: Cardiovascular Disease

## 2015-05-10 ENCOUNTER — Ambulatory Visit (INDEPENDENT_AMBULATORY_CARE_PROVIDER_SITE_OTHER): Payer: BLUE CROSS/BLUE SHIELD | Admitting: Cardiovascular Disease

## 2015-05-10 VITALS — BP 128/80 | HR 86 | Ht 71.0 in | Wt 234.0 lb

## 2015-05-10 DIAGNOSIS — R9431 Abnormal electrocardiogram [ECG] [EKG]: Secondary | ICD-10-CM | POA: Diagnosis not present

## 2015-05-10 DIAGNOSIS — I10 Essential (primary) hypertension: Secondary | ICD-10-CM

## 2015-05-10 DIAGNOSIS — E785 Hyperlipidemia, unspecified: Secondary | ICD-10-CM

## 2015-05-10 NOTE — Assessment & Plan Note (Signed)
Mr. [name] was referred from [facility] for preoperative clearance for elective left total knee replacement scheduled for next week. His EKG showed T-wave inversion in leads 3 with isoelectric Q waves in V3. Overall this is a fairly normal EKG. His risk factors include treated hypertension and hyperlipidemia. He's never had a heart attack or stroke. He denies chest pain or shortness of breath. I believe he has a low risk for his upcoming total knee replacement.

## 2015-05-10 NOTE — Assessment & Plan Note (Signed)
History of hypertension blood pressure measurements at 120/80. He is on losartan. Continue current meds at current dosing

## 2015-05-10 NOTE — Progress Notes (Signed)
05/10/2015 Jethro Bastosarrell R Maffia   09/14/1955  [id number]  Primary Physician Sissy HoffSWAYNE,DAVID W, MD Primary Cardiologist: Runell GessJonathan J. Jartavious Mckimmy MD Roseanne RenoFACP,FACC,FAHA, FSCAI   HPI:  History, there is a delightful 59 year old moderately overweight married Caucasian male father of one stepdaughter [facility] for preoperative clearance before elective left total knee replacement scheduled for next week. His primary care physician is Dr. Kyra Searlesavid Swayn eand with a peak surgeon Dr. Gean BirchwoodFrank Rowan. His cardiac risk factor profile is notable for treated hypertension and hyperlipidemia. There is no family history. He does not smoke. He's never had a heart attack or stroke and denies chest pain or shortness of breath. His EKG showed nonspecific changes.   Current Outpatient Prescriptions  Medication Sig Dispense Refill  . acetaminophen (TYLENOL) 650 MG CR tablet Take 1,300 mg by mouth every 8 (eight) hours as needed for pain.    Marland Kitchen. aspirin EC 81 MG tablet Take 81 mg by mouth daily.    Marland Kitchen. CINNAMON PO Take 2 capsules by mouth daily.     . Flaxseed, Linseed, (FLAXSEED OIL PO) Take 1,400 mg by mouth daily.     Marland Kitchen. losartan (COZAAR) 100 MG tablet Take 100 mg by mouth daily.    . meloxicam (MOBIC) 15 MG tablet Take 15 mg by mouth daily.    . Multiple Vitamin (MULTIVITAMIN WITH MINERALS) TABS Take 1 tablet by mouth daily.    . Omega-3 Fatty Acids (FISH OIL) 1200 MG CAPS Take 1 capsule by mouth daily.    . rosuvastatin (CRESTOR) 10 MG tablet Take 10 mg by mouth daily.    . Testosterone (ANDROGEL) 20.25 MG/1.25GM (1.62%) GEL Place 1 Squirt onto the skin daily.    . vitamin C (ASCORBIC ACID) 500 MG tablet Take 500 mg by mouth daily.     No current facility-administered medications for this visit.    Allergies  Allergen Reactions  . Lipitor [Atorvastatin] Other (See Comments)    Muscle Aches    Social History   Social History  . Marital Status: Married    Spouse Name: N/A  . Number of Children: N/A    . Years of Education: N/A   Occupational History  . Not on file.   Social History Main Topics  . Smoking status: Never Smoker   . Smokeless tobacco: Never Used  . Alcohol Use: Yes     Comment: rarely  . Drug Use: No  . Sexual Activity: Yes   Other Topics Concern  . Not on file   Social History Narrative     Review of Systems: General: negative for chills, fever, night sweats or weight changes.  Cardiovascular: negative for chest pain, dyspnea on exertion, edema, orthopnea, palpitations, paroxysmal nocturnal dyspnea or shortness of breath Dermatological: negative for rash Respiratory: negative for cough or wheezing Urologic: negative for hematuria Abdominal: negative for nausea, vomiting, diarrhea, bright red blood per rectum, melena, or hematemesis Neurologic: negative for visual changes, syncope, or dizziness All other systems reviewed and are otherwise negative except as noted above.    Blood pressure 128/80, pulse 86, height 5\' 11"  (1.803 m), weight 234 lb (106.142 kg).  General appearance: alert and no distress Neck: no adenopathy, no carotid bruit, no JVD, supple, symmetrical, trachea midline and thyroid not enlarged, symmetric, no tenderness/mass/nodules Lungs: clear to auscultation bilaterally Heart: regular rate and rhythm, S1, S2 normal, no murmur, click, rub or gallop Extremities: extremities normal, atraumatic, no cyanosis or edema  EKG not performed today but personally reviewed  ASSESSMENT AND PLAN:   Hyperlipidemia History of hyperlipidemia on Crestor followed by his PCP  Essential hypertension History of hypertension blood pressure measurements at 120/80. He is on losartan. Continue current meds at current dosing  Abnormal EKG Mr. Santerre was referred from [facility] for preoperative clearance for elective left total knee replacement scheduled for next week. His EKG showed T-wave inversion in leads 3 with isoelectric Q waves in V3. Overall this  is a fairly normal EKG. His risk factors include treated hypertension and hyperlipidemia. He's never had a heart attack or stroke. He denies chest pain or shortness of breath. I believe he has a low risk for his upcoming total knee replacement.      Runell Gess MD FACP,FACC,FAHA, [facility] 05/10/2015 4:40 PM

## 2015-05-10 NOTE — Assessment & Plan Note (Signed)
History of hyperlipidemia on Crestor followed by his PCP 

## 2015-05-10 NOTE — Patient Instructions (Signed)
Medication Instructions:  Your physician recommends that you continue on your current medications as directed. Please refer to the Current Medication list given to you today.   Labwork: none  Testing/Procedures: none  Follow-Up: Follow up with Dr. Berry as needed.   Any Other Special Instructions Will Be Listed Below (If Applicable).     If you need a refill on your cardiac medications before your next appointment, please call your pharmacy.   

## 2015-05-17 MED ORDER — DEXTROSE-NACL 5-0.45 % IV SOLN: INTRAVENOUS | Status: DC

## 2015-05-17 MED ORDER — CEFAZOLIN SODIUM-DEXTROSE 2-3 GM-% IV SOLR
2.0000 g | INTRAVENOUS | Status: AC
  Administered 2015-05-18: 2 g via INTRAVENOUS
  Filled 2015-05-17: qty 50

## 2015-05-17 NOTE — H&P (Signed)
TOTAL KNEE ADMISSION H&P  Patient is being admitted for left total knee arthroplasty.  Subjective:  Chief Complaint:left knee pain.  HPI: James Bullock, 59 y.o. male, has a history of pain and functional disability in the left knee due to arthritis and has failed non-surgical conservative treatments for greater than 12 weeks to includeNSAID's and/or analgesics, corticosteriod injections, flexibility and strengthening excercises, use of assistive devices and activity modification.  Onset of symptoms was abrupt, starting 2 years ago with gradually worsening course since that time. The patient noted prior procedures on the knee to include  arthroscopy on the left knee(s).  Patient currently rates pain in the left knee(s) at 10 out of 10 with activity. Patient has night pain, worsening of pain with activity and weight bearing, pain that interferes with activities of daily living, pain with passive range of motion, crepitus and joint swelling.  Patient has evidence of subchondral sclerosis, joint subluxation and joint space narrowing by imaging studies.  There is no active infection.  Patient Active Problem List   Diagnosis Date Noted  . Essential hypertension 05/10/2015  . Hyperlipidemia 05/10/2015  . Abnormal EKG 05/10/2015   Past Medical History  Diagnosis Date  . Hyperlipemia     takes Crestor daily  . Arthritis     knees  . Sleep apnea     uses CPAP nightly  . Hypertension     takes Losartan daily  . History of colon polyps     benign  . Joint pain   . Diverticulosis     Past Surgical History  Procedure Laterality Date  . Shoulder arthroscopy Right 1983  . Colonoscopy      x 3  . Nerve and artery repair Right 01/08/2012    index finger  . Knee arthroscopy with medial menisectomy Left 08/13/2014    Procedure: LEFT ARTHROSCOPY KNEE PARTIAL MEDIAL MENISECTOMY CHONDDROPLASTY;  Surgeon: Nestor LewandowskyFrank J Rowan, MD;  Location: [facility];  Service: Orthopedics;  Laterality: Left;   . Chondroplasty Left 08/13/2014    Procedure: CHONDROPLASTY;  Surgeon: Nestor LewandowskyFrank J Rowan, MD;  Location: [facility];  Service: Orthopedics;  Laterality: Left;    No prescriptions prior to admission   Allergies  Allergen Reactions  . Lipitor [Atorvastatin] Other (See Comments)    Muscle Aches    Social History  Substance Use Topics  . Smoking status: Never Smoker   . Smokeless tobacco: Never Used  . Alcohol Use: Yes     Comment: rarely    Family History  Problem Relation Age of Onset  . Hypertension Mother   . Hypertension Father   . Diabetes type II Father      Review of Systems  Constitutional: Negative.   HENT: Negative.   Eyes: Negative.   Respiratory: Negative.   Cardiovascular: Negative.   Gastrointestinal: Negative.   Genitourinary: Negative.   Musculoskeletal: Positive for joint pain.  Skin: Negative.   Neurological: Negative.   Endo/Heme/Allergies: Negative.   Psychiatric/Behavioral: Negative.     Objective:  Physical Exam  Constitutional: He is oriented to person, place, and time. He appears well-developed and well-nourished.  HENT:  Head: Normocephalic and atraumatic.  Eyes: Pupils are equal, round, and reactive to light.  Neck: Normal range of motion. Neck supple.  Cardiovascular: Intact distal pulses.   Respiratory: Effort normal.  Musculoskeletal: He exhibits tenderness.  Both knees have 5 flexion contractures obvious varus deformities.  Tender along the medial joint line, left greater than right one plus effusion  on the left one plus effusion on the right, flexion is 120 bilaterally.  Collateral ligaments are stable.  Varus stress exacerbates his pain.  Neurological: He is alert and oriented to person, place, and time.  Skin: Skin is warm and dry.  Psychiatric: He has a normal mood and affect. His behavior is normal. Judgment and thought content normal.    Vital signs in last 24 hours:    Labs:   Estimated body mass index is  32.93 kg/(m^2) as calculated from the following:   Height as of 08/13/14:  (1.803 m).   Weight as of 08/13/14: 107.049 kg (236 lb).   Imaging Review Plain radiographs demonstrate end-stage arthritis of both knees, bone-on-bone 5-6 mm of lateral subluxation of tibias beneath the femurs.  Assessment/Plan:  End stage arthritis, left knee   The patient history, physical examination, clinical judgment of the provider and imaging studies are consistent with end stage degenerative joint disease of the left knee(s) and total knee arthroplasty is deemed medically necessary. The treatment options including medical management, injection therapy arthroscopy and arthroplasty were discussed at length. The risks and benefits of total knee arthroplasty were presented and reviewed. The risks due to aseptic loosening, infection, stiffness, patella tracking problems, thromboembolic complications and other imponderables were discussed. The patient acknowledged the explanation, agreed to proceed with the plan and consent was signed. Patient is being admitted for inpatient treatment for surgery, pain control, PT, OT, prophylactic antibiotics, VTE prophylaxis, progressive ambulation and ADL's and discharge planning. The patient is planning to be discharged home with home health services

## 2015-05-18 ENCOUNTER — Encounter (HOSPITAL_COMMUNITY): Payer: Self-pay | Admitting: *Deleted

## 2015-05-18 ENCOUNTER — Encounter (HOSPITAL_COMMUNITY)
Admission: RE | Disposition: A | Discharge status: Home Health | Payer: Self-pay | Source: Ambulatory Visit | Attending: Orthopedic Surgery

## 2015-05-18 ENCOUNTER — Inpatient Hospital Stay (HOSPITAL_COMMUNITY)
Admission: RE | Admit: 2015-05-18 | Discharge: 2015-05-20 | DRG: 470 | Disposition: A | Discharge status: Home Health | Payer: BLUE CROSS/BLUE SHIELD | Source: Ambulatory Visit | Attending: Orthopedic Surgery | Admitting: Orthopedic Surgery

## 2015-05-18 ENCOUNTER — Inpatient Hospital Stay (HOSPITAL_COMMUNITY): Payer: BLUE CROSS/BLUE SHIELD | Admitting: Anesthesiology

## 2015-05-18 ENCOUNTER — Inpatient Hospital Stay (HOSPITAL_COMMUNITY): Payer: BLUE CROSS/BLUE SHIELD | Admitting: Vascular Surgery

## 2015-05-18 DIAGNOSIS — E6609 Other obesity due to excess calories: Secondary | ICD-10-CM | POA: Diagnosis present

## 2015-05-18 DIAGNOSIS — M1710 Unilateral primary osteoarthritis, unspecified knee: Secondary | ICD-10-CM | POA: Diagnosis present

## 2015-05-18 DIAGNOSIS — M1712 Unilateral primary osteoarthritis, left knee: Secondary | ICD-10-CM | POA: Diagnosis present

## 2015-05-18 DIAGNOSIS — G4730 Sleep apnea, unspecified: Secondary | ICD-10-CM | POA: Diagnosis present

## 2015-05-18 DIAGNOSIS — M25562 Pain in left knee: Secondary | ICD-10-CM | POA: Diagnosis present

## 2015-05-18 DIAGNOSIS — Z6832 Body mass index (BMI) 32.0-32.9, adult: Secondary | ICD-10-CM | POA: Diagnosis not present

## 2015-05-18 DIAGNOSIS — D62 Acute posthemorrhagic anemia: Secondary | ICD-10-CM | POA: Diagnosis not present

## 2015-05-18 DIAGNOSIS — Z833 Family history of diabetes mellitus: Secondary | ICD-10-CM

## 2015-05-18 DIAGNOSIS — E785 Hyperlipidemia, unspecified: Secondary | ICD-10-CM | POA: Diagnosis present

## 2015-05-18 DIAGNOSIS — I10 Essential (primary) hypertension: Secondary | ICD-10-CM | POA: Diagnosis present

## 2015-05-18 DIAGNOSIS — Z8249 Family history of ischemic heart disease and other diseases of the circulatory system: Secondary | ICD-10-CM | POA: Diagnosis not present

## 2015-05-18 HISTORY — PX: TOTAL KNEE ARTHROPLASTY: SHX125

## 2015-05-18 SURGERY — ARTHROPLASTY, KNEE, TOTAL
Anesthesia: Monitor Anesthesia Care | Site: Knee | Laterality: Left

## 2015-05-18 MED ORDER — OXYCODONE HCL 5 MG PO TABS
5.0000 mg | ORAL_TABLET | ORAL | Status: DC | PRN
  Administered 2015-05-18 – 2015-05-19 (×8): 10 mg via ORAL
  Administered 2015-05-20: 5 mg via ORAL
  Filled 2015-05-18 (×3): qty 2
  Filled 2015-05-18: qty 1
  Filled 2015-05-18 (×4): qty 2

## 2015-05-18 MED ORDER — PHENOL 1.4 % MT LIQD: 1.0000 | OROMUCOSAL | Status: DC | PRN

## 2015-05-18 MED ORDER — ONDANSETRON HCL 4 MG/2ML IJ SOLN: 4.0000 mg | Freq: Four times a day (QID) | INTRAMUSCULAR | Status: DC | PRN

## 2015-05-18 MED ORDER — DEXTROSE 5 % IV SOLN
10.0000 mg | INTRAVENOUS | Status: DC | PRN
  Administered 2015-05-18: 20 ug/min via INTRAVENOUS

## 2015-05-18 MED ORDER — HYDROMORPHONE HCL 1 MG/ML IJ SOLN
0.2500 mg | INTRAMUSCULAR | Status: DC | PRN
  Administered 2015-05-18 (×4): 0.5 mg via INTRAVENOUS

## 2015-05-18 MED ORDER — LIDOCAINE HCL (CARDIAC) 20 MG/ML IV SOLN
INTRAVENOUS | Status: AC
  Filled 2015-05-18: qty 5

## 2015-05-18 MED ORDER — MIDAZOLAM HCL 2 MG/2ML IJ SOLN
INTRAMUSCULAR | Status: AC
  Filled 2015-05-18: qty 2

## 2015-05-18 MED ORDER — METHOCARBAMOL 500 MG PO TABS: 500.0000 mg | ORAL_TABLET | Freq: Two times a day (BID) | ORAL | Status: DC

## 2015-05-18 MED ORDER — KCL IN DEXTROSE-NACL 20-5-0.45 MEQ/L-%-% IV SOLN
INTRAVENOUS | Status: DC
  Administered 2015-05-18 – 2015-05-19 (×2): via INTRAVENOUS
  Filled 2015-05-18 (×2): qty 1000

## 2015-05-18 MED ORDER — PROPOFOL 10 MG/ML IV BOLUS
INTRAVENOUS | Status: DC | PRN
  Administered 2015-05-18: 50 mg via INTRAVENOUS

## 2015-05-18 MED ORDER — ACETAMINOPHEN 650 MG RE SUPP: 650.0000 mg | Freq: Four times a day (QID) | RECTAL | Status: DC | PRN

## 2015-05-18 MED ORDER — FLEET ENEMA 7-19 GM/118ML RE ENEM: 1.0000 | ENEMA | Freq: Once | RECTAL | Status: DC | PRN

## 2015-05-18 MED ORDER — ONDANSETRON HCL 4 MG/2ML IJ SOLN
INTRAMUSCULAR | Status: DC | PRN
  Administered 2015-05-18: 4 mg via INTRAVENOUS

## 2015-05-18 MED ORDER — ONDANSETRON HCL 4 MG/2ML IJ SOLN
INTRAMUSCULAR | Status: AC
  Filled 2015-05-18: qty 2

## 2015-05-18 MED ORDER — SENNOSIDES-DOCUSATE SODIUM 8.6-50 MG PO TABS
1.0000 | ORAL_TABLET | Freq: Every evening | ORAL | Status: DC | PRN
Start: 2015-05-18 — End: 2015-05-20

## 2015-05-18 MED ORDER — PROPOFOL 500 MG/50ML IV EMUL
INTRAVENOUS | Status: DC | PRN
  Administered 2015-05-18: 50 ug/kg/min via INTRAVENOUS

## 2015-05-18 MED ORDER — DOCUSATE SODIUM 100 MG PO CAPS
100.0000 mg | ORAL_CAPSULE | Freq: Two times a day (BID) | ORAL | Status: DC
  Administered 2015-05-18 – 2015-05-20 (×4): 100 mg via ORAL
  Filled 2015-05-18 (×4): qty 1

## 2015-05-18 MED ORDER — OXYCODONE HCL 5 MG/5ML PO SOLN
5.0000 mg | Freq: Once | ORAL | Status: DC | PRN
Start: 2015-05-18 — End: 2015-05-18

## 2015-05-18 MED ORDER — METHOCARBAMOL 500 MG PO TABS
ORAL_TABLET | ORAL | Status: AC
  Administered 2015-05-18: 500 mg via ORAL
  Filled 2015-05-18: qty 1

## 2015-05-18 MED ORDER — OXYCODONE HCL 5 MG PO TABS: 5.0000 mg | ORAL_TABLET | Freq: Once | ORAL | Status: DC | PRN

## 2015-05-18 MED ORDER — BUPIVACAINE LIPOSOME 1.3 % IJ SUSP
20.0000 mL | Freq: Once | INTRAMUSCULAR | Status: AC
  Administered 2015-05-18: 20 mL
  Filled 2015-05-18: qty 20

## 2015-05-18 MED ORDER — FENTANYL CITRATE (PF) 100 MCG/2ML IJ SOLN
INTRAMUSCULAR | Status: DC
Start: 2015-05-18 — End: 2015-05-18
  Filled 2015-05-18: qty 2

## 2015-05-18 MED ORDER — 0.9 % SODIUM CHLORIDE (POUR BTL) OPTIME
TOPICAL | Status: DC | PRN
  Administered 2015-05-18: 1000 mL

## 2015-05-18 MED ORDER — ASPIRIN EC 325 MG PO TBEC: 325.0000 mg | DELAYED_RELEASE_TABLET | Freq: Two times a day (BID) | ORAL | Status: DC

## 2015-05-18 MED ORDER — BUPIVACAINE IN DEXTROSE 0.75-8.25 % IT SOLN
INTRATHECAL | Status: DC | PRN
  Administered 2015-05-18: 75 mg via INTRATHECAL

## 2015-05-18 MED ORDER — MENTHOL 3 MG MT LOZG: 1.0000 | LOZENGE | OROMUCOSAL | Status: DC | PRN

## 2015-05-18 MED ORDER — CEFUROXIME SODIUM 1.5 G IJ SOLR
INTRAMUSCULAR | Status: DC | PRN
  Administered 2015-05-18: 1.5 g

## 2015-05-18 MED ORDER — LIDOCAINE HCL (CARDIAC) 20 MG/ML IV SOLN
INTRAVENOUS | Status: DC | PRN
  Administered 2015-05-18: 20 mg via INTRAVENOUS

## 2015-05-18 MED ORDER — HYDROMORPHONE HCL 1 MG/ML IJ SOLN
INTRAMUSCULAR | Status: AC
  Filled 2015-05-18: qty 1

## 2015-05-18 MED ORDER — METHOCARBAMOL 500 MG PO TABS
500.0000 mg | ORAL_TABLET | Freq: Four times a day (QID) | ORAL | Status: DC | PRN
  Administered 2015-05-18 – 2015-05-19 (×4): 500 mg via ORAL
  Filled 2015-05-18 (×3): qty 1

## 2015-05-18 MED ORDER — ONDANSETRON HCL 4 MG/2ML IJ SOLN: 4.0000 mg | Freq: Once | INTRAMUSCULAR | Status: DC | PRN

## 2015-05-18 MED ORDER — DIPHENHYDRAMINE HCL 12.5 MG/5ML PO ELIX: 12.5000 mg | ORAL_SOLUTION | ORAL | Status: DC | PRN

## 2015-05-18 MED ORDER — OXYCODONE HCL 5 MG PO TABS
ORAL_TABLET | ORAL | Status: AC
  Administered 2015-05-18: 10 mg via ORAL
  Filled 2015-05-18: qty 2

## 2015-05-18 MED ORDER — ONDANSETRON HCL 4 MG PO TABS
4.0000 mg | ORAL_TABLET | Freq: Four times a day (QID) | ORAL | Status: DC | PRN
  Administered 2015-05-19: 4 mg via ORAL
  Filled 2015-05-18: qty 1

## 2015-05-18 MED ORDER — HYDROMORPHONE HCL 1 MG/ML IJ SOLN
0.5000 mg | INTRAMUSCULAR | Status: DC | PRN
  Administered 2015-05-18 – 2015-05-19 (×4): 1 mg via INTRAVENOUS
  Filled 2015-05-18 (×4): qty 1

## 2015-05-18 MED ORDER — ALUM & MAG HYDROXIDE-SIMETH 200-200-20 MG/5ML PO SUSP
30.0000 mL | ORAL | Status: DC | PRN
Start: 2015-05-18 — End: 2015-05-20

## 2015-05-18 MED ORDER — BISACODYL 5 MG PO TBEC: 5.0000 mg | DELAYED_RELEASE_TABLET | Freq: Every day | ORAL | Status: DC | PRN

## 2015-05-18 MED ORDER — LACTATED RINGERS IV SOLN
INTRAVENOUS | Status: DC
  Administered 2015-05-18 (×2): via INTRAVENOUS

## 2015-05-18 MED ORDER — SODIUM CHLORIDE 0.9 % IR SOLN
Status: DC | PRN
  Administered 2015-05-18: 3000 mL

## 2015-05-18 MED ORDER — ROCURONIUM BROMIDE 50 MG/5ML IV SOLN
INTRAVENOUS | Status: AC
  Filled 2015-05-18: qty 1

## 2015-05-18 MED ORDER — LOSARTAN POTASSIUM 50 MG PO TABS
100.0000 mg | ORAL_TABLET | Freq: Every day | ORAL | Status: DC
  Administered 2015-05-18 – 2015-05-20 (×3): 100 mg via ORAL
  Filled 2015-05-18 (×3): qty 2

## 2015-05-18 MED ORDER — ASPIRIN EC 325 MG PO TBEC
325.0000 mg | DELAYED_RELEASE_TABLET | Freq: Every day | ORAL | Status: DC
  Administered 2015-05-19 – 2015-05-20 (×2): 325 mg via ORAL
  Filled 2015-05-18 (×2): qty 1

## 2015-05-18 MED ORDER — DEXTROSE 5 % IV SOLN
500.0000 mg | Freq: Four times a day (QID) | INTRAVENOUS | Status: DC | PRN
  Filled 2015-05-18 (×2): qty 5

## 2015-05-18 MED ORDER — ACETAMINOPHEN 325 MG PO TABS: 650.0000 mg | ORAL_TABLET | Freq: Four times a day (QID) | ORAL | Status: DC | PRN

## 2015-05-18 MED ORDER — NEOSTIGMINE METHYLSULFATE 10 MG/10ML IV SOLN
INTRAVENOUS | Status: AC
  Filled 2015-05-18: qty 1

## 2015-05-18 MED ORDER — CEFUROXIME SODIUM 1.5 G IJ SOLR
INTRAMUSCULAR | Status: AC
  Filled 2015-05-18: qty 1.5

## 2015-05-18 MED ORDER — MELOXICAM 15 MG PO TABS
15.0000 mg | ORAL_TABLET | Freq: Every day | ORAL | Status: DC
  Administered 2015-05-19: 15 mg via ORAL
  Filled 2015-05-18: qty 1

## 2015-05-18 MED ORDER — METOCLOPRAMIDE HCL 5 MG/ML IJ SOLN: 5.0000 mg | Freq: Three times a day (TID) | INTRAMUSCULAR | Status: DC | PRN

## 2015-05-18 MED ORDER — PROPOFOL 10 MG/ML IV BOLUS
INTRAVENOUS | Status: AC
  Filled 2015-05-18: qty 20

## 2015-05-18 MED ORDER — ROSUVASTATIN CALCIUM 10 MG PO TABS
10.0000 mg | ORAL_TABLET | Freq: Every day | ORAL | Status: DC
  Administered 2015-05-18 – 2015-05-20 (×3): 10 mg via ORAL
  Filled 2015-05-18 (×3): qty 1

## 2015-05-18 MED ORDER — MIDAZOLAM HCL 2 MG/2ML IJ SOLN
INTRAMUSCULAR | Status: AC
  Filled 2015-05-18: qty 4

## 2015-05-18 MED ORDER — GLYCOPYRROLATE 0.2 MG/ML IJ SOLN
INTRAMUSCULAR | Status: AC
  Filled 2015-05-18: qty 4

## 2015-05-18 MED ORDER — TRANEXAMIC ACID 1000 MG/10ML IV SOLN
1000.0000 mg | INTRAVENOUS | Status: AC
  Administered 2015-05-18: 1000 mg via INTRAVENOUS
  Filled 2015-05-18: qty 10

## 2015-05-18 MED ORDER — METOCLOPRAMIDE HCL 5 MG PO TABS: 5.0000 mg | ORAL_TABLET | Freq: Three times a day (TID) | ORAL | Status: DC | PRN

## 2015-05-18 MED ORDER — FENTANYL CITRATE (PF) 250 MCG/5ML IJ SOLN
INTRAMUSCULAR | Status: AC
  Filled 2015-05-18: qty 5

## 2015-05-18 MED ORDER — FENTANYL CITRATE (PF) 100 MCG/2ML IJ SOLN
100.0000 ug | Freq: Once | INTRAMUSCULAR | Status: AC
  Administered 2015-05-18 (×3): 50 ug via INTRAVENOUS

## 2015-05-18 MED ORDER — TESTOSTERONE 20.25 MG/1.25GM (1.62%) TD GEL: 1.0000 | Freq: Every day | TRANSDERMAL | Status: DC

## 2015-05-18 MED ORDER — OXYCODONE-ACETAMINOPHEN 5-325 MG PO TABS: 1.0000 | ORAL_TABLET | ORAL | Status: DC | PRN

## 2015-05-18 MED ORDER — DEXAMETHASONE SODIUM PHOSPHATE 10 MG/ML IJ SOLN
INTRAMUSCULAR | Status: AC
  Filled 2015-05-18: qty 1

## 2015-05-18 MED ORDER — MIDAZOLAM HCL 2 MG/2ML IJ SOLN
2.0000 mg | Freq: Once | INTRAMUSCULAR | Status: AC
  Administered 2015-05-18: 2 mg via INTRAVENOUS

## 2015-05-18 SURGICAL SUPPLY — 60 items
BANDAGE ESMARK 6X9 LF (GAUZE/BANDAGES/DRESSINGS) ×1 IMPLANT
BLADE SAG 18X100X1.27 (BLADE) ×3 IMPLANT
BLADE SAW SGTL 13X75X1.27 (BLADE) ×3 IMPLANT
BLADE SURG ROTATE 9660 (MISCELLANEOUS) IMPLANT
BNDG CMPR 9X6 STRL LF SNTH (GAUZE/BANDAGES/DRESSINGS) ×1
BNDG CMPR MED 10X6 ELC LF (GAUZE/BANDAGES/DRESSINGS) ×1
BNDG ELASTIC 6X10 VLCR STRL LF (GAUZE/BANDAGES/DRESSINGS) ×3 IMPLANT
BNDG ESMARK 6X9 LF (GAUZE/BANDAGES/DRESSINGS) ×3
BOWL SMART MIX CTS (DISPOSABLE) ×3 IMPLANT
CAPT KNEE TOTAL 3 ATTUNE ×2 IMPLANT
CEMENT HV SMART SET (Cement) ×6 IMPLANT
COVER SURGICAL LIGHT HANDLE (MISCELLANEOUS) ×3 IMPLANT
CUFF TOURNIQUET SINGLE 34IN LL (TOURNIQUET CUFF) ×3 IMPLANT
CUFF TOURNIQUET SINGLE 44IN (TOURNIQUET CUFF) IMPLANT
DRAPE EXTREMITY T 121X128X90 (DRAPE) ×3 IMPLANT
DRAPE U-SHAPE 47X51 STRL (DRAPES) ×3 IMPLANT
DURAPREP 26ML APPLICATOR (WOUND CARE) ×6 IMPLANT
ELECT REM PT RETURN 9FT ADLT (ELECTROSURGICAL) ×3
ELECTRODE REM PT RTRN 9FT ADLT (ELECTROSURGICAL) ×1 IMPLANT
EVACUATOR 1/8 PVC DRAIN (DRAIN) IMPLANT
FACESHIELD LNG OPTICON STERILE (SAFETY) ×2 IMPLANT
GAUZE SPONGE 4X4 12PLY STRL (GAUZE/BANDAGES/DRESSINGS) ×4 IMPLANT
GAUZE XEROFORM 1X8 LF (GAUZE/BANDAGES/DRESSINGS) ×3 IMPLANT
GLOVE BIO SURGEON STRL SZ7.5 (GLOVE) ×3 IMPLANT
GLOVE BIO SURGEON STRL SZ8.5 (GLOVE) ×3 IMPLANT
GLOVE BIOGEL PI IND STRL 8 (GLOVE) ×1 IMPLANT
GLOVE BIOGEL PI IND STRL 9 (GLOVE) ×1 IMPLANT
GLOVE BIOGEL PI INDICATOR 8 (GLOVE) ×2
GLOVE BIOGEL PI INDICATOR 9 (GLOVE) ×2
GOWN STRL REUS W/ TWL LRG LVL3 (GOWN DISPOSABLE) ×1 IMPLANT
GOWN STRL REUS W/ TWL XL LVL3 (GOWN DISPOSABLE) ×2 IMPLANT
GOWN STRL REUS W/TWL LRG LVL3 (GOWN DISPOSABLE) ×6
GOWN STRL REUS W/TWL XL LVL3 (GOWN DISPOSABLE) ×6
HANDPIECE INTERPULSE COAX TIP (DISPOSABLE) ×3
HOOD PEEL AWAY FACE SHEILD DIS (HOOD) ×6 IMPLANT
KIT BASIN OR (CUSTOM PROCEDURE TRAY) ×3 IMPLANT
KIT ROOM TURNOVER OR (KITS) ×3 IMPLANT
MANIFOLD NEPTUNE II (INSTRUMENTS) ×3 IMPLANT
NDL SPNL 18GX3.5 QUINCKE PK (NEEDLE) IMPLANT
NEEDLE SPNL 18GX3.5 QUINCKE PK (NEEDLE) ×3 IMPLANT
NS IRRIG 1000ML POUR BTL (IV SOLUTION) ×3 IMPLANT
PACK TOTAL JOINT (CUSTOM PROCEDURE TRAY) ×3 IMPLANT
PACK UNIVERSAL I (CUSTOM PROCEDURE TRAY) ×3 IMPLANT
PAD ARMBOARD 7.5X6 YLW CONV (MISCELLANEOUS) ×6 IMPLANT
PADDING CAST COTTON 6X4 STRL (CAST SUPPLIES) ×3 IMPLANT
SAGITTAL BLADE IMPLANT
SET HNDPC FAN SPRY TIP SCT (DISPOSABLE) ×1 IMPLANT
SUT VIC AB 0 CT1 27 (SUTURE) ×3
SUT VIC AB 0 CT1 27XBRD ANBCTR (SUTURE) ×1 IMPLANT
SUT VIC AB 1 CTX 36 (SUTURE) ×3
SUT VIC AB 1 CTX36XBRD ANBCTR (SUTURE) ×1 IMPLANT
SUT VIC AB 2-0 CT1 27 (SUTURE) ×3
SUT VIC AB 2-0 CT1 TAPERPNT 27 (SUTURE) ×1 IMPLANT
SUT VIC AB 3-0 CT1 27 (SUTURE) ×3
SUT VIC AB 3-0 CT1 TAPERPNT 27 (SUTURE) ×1 IMPLANT
SUT VIC AB 3-0 FS2 27 (SUTURE) ×3 IMPLANT
SYR 50ML LL SCALE MARK (SYRINGE) ×3 IMPLANT
TOWEL OR 17X24 6PK STRL BLUE (TOWEL DISPOSABLE) ×3 IMPLANT
TOWEL OR 17X26 10 PK STRL BLUE (TOWEL DISPOSABLE) ×3 IMPLANT
TRAY CATH 16FR W/PLASTIC CATH (SET/KITS/TRAYS/PACK) ×2 IMPLANT

## 2015-05-18 NOTE — Anesthesia Preprocedure Evaluation (Signed)
Anesthesia Evaluation  Patient identified by MRN, date of birth, ID band Patient awake    Reviewed: Allergy & Precautions, NPO status , Unable to perform ROS - Chart review only  Airway Mallampati: II  TM Distance: >3 FB Neck ROM: Full    Dental  (+) Teeth Intact, Dental Advisory Given   Pulmonary    breath sounds clear to auscultation       Cardiovascular hypertension,  Rhythm:Regular Rate:Normal     Neuro/Psych    GI/Hepatic   Endo/Other    Renal/GU      Musculoskeletal   Abdominal (+) + obese,   Peds  Hematology   Anesthesia Other Findings   Reproductive/Obstetrics                             Anesthesia Physical Anesthesia Plan  ASA: III  Anesthesia Plan: MAC and Spinal   Post-op Pain Management: MAC Combined w/ Regional for Post-op pain   Induction: Intravenous  Airway Management Planned: Natural Airway and Simple Face Mask  Additional Equipment:   Intra-op Plan:   Post-operative Plan:   Informed Consent:   Dental advisory given  Plan Discussed with: CRNA and Anesthesiologist  Anesthesia Plan Comments: (DJD L. Knee Sleep apnea on CPAP Hypertension  Plan SAB with ACB  Kipp Broodavid Sherl Yzaguirre)        Anesthesia Quick Evaluation

## 2015-05-18 NOTE — Progress Notes (Signed)
RT note : Placed patient om CPAP 8cmH2O per home settings with patient own circuit and nasal pillows. atient is resting comfortably.

## 2015-05-18 NOTE — Progress Notes (Signed)
Orthopedic Tech Progress Note Patient Details:  James Bullock 01/30/1956 [id number] Applied ohf to bed CPM Left Knee CPM Left Knee: On Left Knee Flexion (Degrees): 40 Left Knee Extension (Degrees): 0   Jennye MoccasinHughes, Taryn Nave Craig 05/18/2015, 6:21 PM

## 2015-05-18 NOTE — Op Note (Signed)
PATIENT ID:      James BastosDarrell R Bullock  MRN:     [id number] DOB/AGE:    03/11/1956 / 59 y.o.       OPERATIVE REPORT    DATE OF PROCEDURE:  05/18/2015       PREOPERATIVE DIAGNOSIS:   LEFT KNEE OSTEOARTHRITIS      Estimated body mass index is 32.65 kg/(m^2) as calculated from the following:   Height as of this encounter: 5\' 11"  (1.803 m).   Weight as of this encounter: 106.142 kg (234 lb).                                                        POSTOPERATIVE DIAGNOSIS:   LEFT KNEE OSTEOARTHRITIS                                                                      PROCEDURE:  Procedure(s): TOTAL KNEE ARTHROPLASTY Using DepuyAttune RP implants #8L Femur, #9Tibia, 7 mm Attune RP bearing, 41 Patella     SURGEON: Taylore Hinde Bullock    ASSISTANT:   Eric K. [employer] PA-C   (Present and scrubbed throughout the case, critical for assistance with exposure, retraction, instrumentation, and closure.)         ANESTHESIA: Spinal, Exparel  EBL: 300  FLUID REPLACEMENT: 1600 crystalloid  TOURNIQUET TIME: 15min  Drains: None  Tranexamic Acid: 1gm iv   COMPLICATIONS:  None         INDICATIONS FOR PROCEDURE: The patient has  LEFT KNEE OSTEOARTHRITIS, Varus deformities, XR shows bone on bone arthritis, lateral subluxation of tibia. Patient has failed all conservative measures including anti-inflammatory medicines, narcotics, attempts at  exercise and weight loss, cortisone injections and viscosupplementation.  Risks and benefits of surgery have been discussed, questions answered.   DESCRIPTION OF PROCEDURE: The patient identified by armband, received  IV antibiotics, in the holding [facility]. Patient taken to the operating room, appropriate anesthetic  monitors were attached, andSpinal anesthesia was  induced. Tourniquet  applied high to the operative thigh. Lateral post and foot positioner  applied to the table, the lower extremity was then prepped and draped  in usual sterile fashion from the toes  to the tourniquet. Time-out procedure was performed. We began the operation, with the knee flexed 120 degrees, by making the anterior midline incision starting at handbreadth above the patella going over the patella 1 cm medial to and 4 cm distal to the tibial tubercle. Small bleeders in the skin and the  subcutaneous tissue identified and cauterized. Transverse retinaculum was incised and reflected medially and a medial parapatellar arthrotomy was accomplished. the patella was everted and theprepatellar fat pad resected. The superficial medial collateral  ligament was then elevated from anterior to posterior along the proximal  flare of the tibia and anterior half of the menisci resected. The knee was hyperflexed exposing bone on bone arthritis. Peripheral and notch osteophytes as well as the cruciate ligaments were then resected. We continued to  work our way around posteriorly along the proximal tibia, and externally  rotated the tibia  subluxing it out from underneath the femur. A McHale  retractor was placed through the notch and a lateral Hohmann retractor  placed, and we then drilled through the proximal tibia in line with the  axis of the tibia followed by an intramedullary guide rod and 2-degree  posterior slope cutting guide. The tibial cutting guide, 3 degree posterior sloped, was pinned into place allowing resection of 3 mm of bone medially and about 8 mm of bone laterally. Satisfied with the tibial resection, we then  entered the distal femur 2 mm anterior to the PCL origin with the  intramedullary guide rod and applied the distal femoral cutting guide  set at 9mm, with 5 degrees of valgus. This was pinned along the  epicondylar axis. At this point, the distal femoral cut was accomplished without difficulty. We then sized for a #8L femoral component and pinned the guide in 0 degrees of external rotation.The chamfer cutting guide was pinned into place. The anterior, posterior, and chamfer  cuts were accomplished without difficulty followed by  the Attune RP box cutting guide and the box cut. We also removed posterior osteophytes from the posterior femoral condyles. At this  time, the knee was brought into full extension. We checked our  extension and flexion gaps and found them symmetric for a 7 mm bearing. Distracting in extension with a lamina spreader, the posterior horns of the menisci were removed, and Exparel, diluted to 60 cc, was injected into the capsule of the knee. The  posterior patella cut was accomplished with the 9.5 mm Attune cutting guide, sized at 41* dome, and the fixation pegs drilled.The knee  was then once again hyperflexed exposing the proximal tibia. We sized for a #9 tibial base plate, applied the smokestack and the conical reamer followed by the the Delta fin keel punch. We then hammered into place the Attune RP trial femoral component, inserted a  7 mm trial bearing, trial patellar button, and took the knee through range of motion from 0-130 degrees. No thumb pressure was required for patellar  Tracking. At this point, the limb was wrapped with an Esmarch bandage and the tourniquet inflated to 350 mmHg. All trial components were removed, mating surfaces irrigated with pulse lavage, and dried with suction and sponges. A double batch of DePuy HV cement with 1500 mg of Zinacef was mixed and applied to all bony metallic mating surfaces except for the posterior condyles of the femur itself. In order, we  hammered into place the tibial tray and removed excess cement, the femoral component and removed excess cement,  The final Attune RP bearing  was inserted, and the knee brought to full extension with compression.  The patellar button was clamped into place, and excess cement  removed. While the cement cured the wound was irrigated out with normal saline solution pulse lavage. Ligament stability and patellar tracking were checked and found to be excellent. The  parapatellar arthrotomy was closed with  running #1 Vicryl suture. The subcutaneous tissue with 0 and 2-0 undyed  Vicryl suture, and the skin with running 3-0 SQ vicryl. A dressing of Xeroform,  4 x 4, dressing sponges, Webril, and Ace wrap applied. The patient  awakened, extubated, and taken to recovery room without difficulty.   James Bullock 05/18/2015, 3:10 PM

## 2015-05-18 NOTE — Anesthesia Procedure Notes (Addendum)
Date/Time: 05/18/2015 2:05 PM Performed by: Fabian NovemberSOLHEIM, JOSHUA SALOMAN Pre-anesthesia Checklist: Patient identified, Emergency Drugs available, Suction available and Patient being monitored Patient Re-evaluated:Patient Re-evaluated prior to inductionOxygen Delivery Method: Simple face mask Number of attempts: 1   Spinal Patient location during procedure: OR Start time: 05/18/2015 1:40 PM End time: 05/18/2015 1:45 PM Staffing Performed by: anesthesiologist  Needle Needle type: Tuohy  Needle gauge: 22 G Needle length: 9 cm Assessment Sensory level: T6 Additional Notes 10 mg 0.75% bupivacaine with 1:200 Epi injected easily

## 2015-05-18 NOTE — Transfer of Care (Signed)
Immediate Anesthesia Transfer of Care Note  Patient: James Bullock  Procedure(s) Performed: Procedure(s): TOTAL KNEE ARTHROPLASTY (Left)  Patient Location: PACU  Anesthesia Type:MAC and Spinal  Level of Consciousness: awake, alert  and oriented  Airway & Oxygen Therapy: Patient Spontanous Breathing and Patient connected to nasal cannula oxygen  Post-op Assessment: Report given to RN and Post -op Vital signs reviewed and stable  Post vital signs: Reviewed and stable  Last Vitals:  Filed Vitals:   05/18/15 1129  BP: 135/90  Pulse: 89  Temp: 36.8 C  Resp: 20    Complications: No apparent anesthesia complications

## 2015-05-18 NOTE — Discharge Instructions (Signed)

## 2015-05-18 NOTE — Interval H&P Note (Signed)
History and Physical Interval Note:  05/18/2015 1:00 PM  James Bullock  has presented today for surgery, with the diagnosis of LEFT KNEE OSTEOARTHRITIS  The various methods of treatment have been discussed with the patient and family. After consideration of risks, benefits and other options for treatment, the patient has consented to  Procedure(s): TOTAL KNEE ARTHROPLASTY (Left) as a surgical intervention .  The patient's history has been reviewed, patient examined, no change in status, stable for surgery.  I have reviewed the patient's chart and labs.  Questions were answered to the patient's satisfaction.     Nestor LewandowskyOWAN,Landyn Lorincz J

## 2015-05-19 ENCOUNTER — Encounter (HOSPITAL_COMMUNITY): Payer: Self-pay | Admitting: Orthopedic Surgery

## 2015-05-19 LAB — BASIC METABOLIC PANEL
Anion gap: 8 (ref 5–15)
BUN: 13 mg/dL (ref 6–20)
CALCIUM: 8.5 mg/dL — AB (ref 8.9–10.3)
CO2: 23 mmol/L (ref 22–32)
CREATININE: 0.98 mg/dL (ref 0.61–1.24)
Chloride: 103 mmol/L (ref 101–111)
GFR calc Af Amer: >60 mL/min (ref >60)
GFR calc non Af Amer: >60 mL/min (ref >60)
GLUCOSE: 180 mg/dL — AB (ref 65–99)
Potassium: 4.2 mmol/L (ref 3.5–5.1)
Sodium: 134 mmol/L — ABNORMAL LOW (ref 135–145)

## 2015-05-19 LAB — CBC
HCT: 36.2 % — ABNORMAL LOW (ref 39.0–52.0)
Hemoglobin: 12.4 g/dL — ABNORMAL LOW (ref 13.0–17.0)
MCH: 32 pg (ref 26.0–34.0)
MCHC: 34.3 g/dL (ref 30.0–36.0)
MCV: 93.3 fL (ref 78.0–100.0)
Platelets: 151 10*3/uL (ref 150–400)
RBC: 3.88 MIL/uL — ABNORMAL LOW (ref 4.22–5.81)
RDW: 12 % (ref 11.5–15.5)
WBC: 9.6 10*3/uL (ref 4.0–10.5)

## 2015-05-19 MED ORDER — MELOXICAM 7.5 MG PO TABS
15.0000 mg | ORAL_TABLET | Freq: Every day | ORAL | Status: DC
  Administered 2015-05-19 – 2015-05-20 (×2): 15 mg via ORAL
  Filled 2015-05-19 (×2): qty 2

## 2015-05-19 NOTE — Progress Notes (Signed)
Pt already wearing CPAP with home nasal prongs when RT checked. Pt in no obvious respiratory distress. RT will continue to monitor.

## 2015-05-19 NOTE — Evaluation (Signed)
Occupational Therapy Evaluation Patient Details Name: James Bullock MRN: [id number] DOB: 10-03-1955 Today's Date: 05/19/2015    History of Present Illness 59 y.o. male, has a history of pain and functional disability in the left knee due to arthritis. S/p left total knee arthroplasty.    Clinical Impression   Pt reports he was independent with ADLs PTA. Currently pt is overall min guard for ADLs and functional mobility with the exception of min A for LB ADLs. All ADL education completed; pt with no further questions or concerns for OT at this time. Pt plan to d/c home with 24/7 supervision from family. Pt ready to d/c from an OT standpoint; signing off at this time. Thank you for this referral.     Follow Up Recommendations  No OT follow up;Supervision/Assistance - 24 hour    Equipment Recommendations  3 in 1 bedside comode    Recommendations for Other Services       Precautions / Restrictions Precautions Precautions: Knee;Fall Precaution Booklet Issued: No Restrictions Weight Bearing Restrictions: Yes LLE Weight Bearing: Weight bearing as tolerated      Mobility Bed Mobility               General bed mobility comments: Pt in recliner, returned to recliner at end of session.  Transfers Overall transfer level: Needs assistance Equipment used: Rolling walker (2 wheeled) Transfers: Sit to/from Stand Sit to Stand: Min guard         General transfer comment: Min guard for safety. VC for hand placement and technique. Pt with quick descent from standing to sit, discussed using UB strength to lower self down to chair. Sit <> stand from chair x 1    Balance Overall balance assessment: Needs assistance Sitting-balance support: Feet supported Sitting balance-Leahy Scale: Good     Standing balance support: Bilateral upper extremity supported Standing balance-Leahy Scale: Poor Standing balance comment: RW for support                            ADL  Overall ADL's : Needs assistance/impaired Eating/Feeding: Set up;Sitting   Grooming: Min guard;Standing       Lower Body Bathing: Minimal assistance;Sit to/from stand       Lower Body Dressing: Minimal assistance;Sit to/from stand Lower Body Dressing Details (indicate cue type and reason): Educated on compensatory strategies for LB ADLs; pt verbalized understainding. Discussed use of AE; pt declined stating wife could help as needed Toilet Transfer: Min guard;Ambulation;BSC;RW (BSC over toilet) Toilet Transfer Details (indicate cue type and reason): Educated on use of 3 in 1 over toilet.      Tub/ Shower Transfer: Min guard;Ambulation;Rolling walker Tub/Shower Transfer Details (indicate cue type and reason): Educated on walk in shower transfer technique; pt return demonstrated understanding. Provided pt with walk in shower transfer handout. Educated on use of 3 in 1 in shower as a seat. Functional mobility during ADLs: Min guard;Rolling walker General ADL Comments: No family present for OT eval. Educated pt on need for close supervision during ADLs and functional mobility, home safety, safety with RW; pt verbalized understanding. Pt reports slight nausea and increased pain following PT session prior to OT eval; RN notified.     Vision     Perception     Praxis      Pertinent Vitals/Pain Pain Assessment: Faces Faces Pain Scale: Hurts even more Pain Location: L knee Pain Descriptors / Indicators: Aching;Discomfort;Sore Pain Intervention(s): Limited activity within patient's tolerance;Monitored  during session;Ice applied;Patient requesting pain meds-RN notified     Hand Dominance     Extremity/Trunk Assessment Upper Extremity Assessment Upper Extremity Assessment: Overall WFL for tasks assessed   Lower Extremity Assessment Lower Extremity Assessment: Defer to PT evaluation   Cervical / Trunk Assessment Cervical / Trunk Assessment: Normal   Communication  Communication Communication: No difficulties   Cognition Arousal/Alertness: Awake/alert Behavior During Therapy: WFL for tasks assessed/performed Overall Cognitive Status: Within Functional Limits for tasks assessed                     General Comments       Exercises Exercises: Total Joint     Shoulder Instructions      Home Living Family/patient expects to be discharged to:: Private residence Living Arrangements: Spouse/significant other;Children Available Help at Discharge: Family;Available 24 hours/day Type of Home: House             Bathroom Shower/Tub: Walk-in Human resources officershower   Bathroom Toilet: Standard Bathroom Accessibility: Yes How Accessible: Accessible via walker Home Equipment: None          Prior Functioning/Environment Level of Independence: Independent             OT Diagnosis: Acute pain   OT Problem List:     OT Treatment/Interventions:      OT Goals(Current goals can be found in the care plan section) Acute Rehab OT Goals Patient Stated Goal: to go home OT Goal Formulation: With patient  OT Frequency:     Barriers to D/C:            Co-evaluation              End of Session Equipment Utilized During Treatment: Gait belt;Rolling walker CPM Left Knee CPM Left Knee: Off Nurse Communication: Patient requests pain meds;Other (comment) (Pt with nausea)  Activity Tolerance: Patient tolerated treatment well Patient left: in chair;with call bell/phone within reach   Time: 1141-1200 OT Time Calculation (min): 19 min Charges:  OT General Charges $OT Visit: 1 Procedure OT Evaluation $Initial OT Evaluation Tier I: 1 Procedure G-Codes:     Gaye AlkenBailey A Tanee Henery M.S., OTR/L Pager: [phone]   05/19/2015, 12:19 PM

## 2015-05-19 NOTE — Progress Notes (Signed)
PATIENT ID: James Bullock  MRN: [id number]  DOB/AGE:  02/06/1956 / 59 y.o.  1 Day Post-Op Procedure(s) (LRB): TOTAL KNEE ARTHROPLASTY (Left)    PROGRESS NOTE Subjective: Patient is alert, oriented, no Nausea, no Vomiting, yes passing gas, no Bowel Movement. Taking PO well. Denies SOB, Chest or Calf Pain. Using Incentive Spirometer, PAS in place. Ambulate WBAT , CPM 0-40 Patient reports pain as 6 on 0-10 scale  .    Objective: Vital signs in last 24 hours: Filed Vitals:   05/18/15 2302 05/19/15 0024 05/19/15 0423 05/19/15 1043  BP:  134/70 125/65 123/76  Pulse: 94 86 88 88  Temp:  98.6 F (37 C) 98.4 F (36.9 C)   TempSrc:      Resp: 16 15 17    Height:      Weight:      SpO2: 97% 98% 100% 96%      Intake/Output from previous day: I/O last 3 completed shifts: In: 2560.4 [I.V.:2560.4] Out: 1450 [Urine:1300; Blood:150]   Intake/Output this shift:     LABORATORY DATA:  Recent Labs  05/19/15 0548  WBC 9.6  HGB 12.4*  HCT 36.2*  PLT 151  NA 134*  K 4.2  CL 103  CO2 23  BUN 13  CREATININE 0.98  GLUCOSE 180*  CALCIUM 8.5*    Examination: Neurologically intact Neurovascular intact Sensation intact distally Intact pulses distally Dorsiflexion/Plantar flexion intact Incision: dressing C/D/I and no drainage No cellulitis present Compartment soft}  Assessment:   1 Day Post-Op Procedure(s) (LRB): TOTAL KNEE ARTHROPLASTY (Left) ADDITIONAL DIAGNOSIS: Expected Acute Blood Loss Anemia, Hypertension and Sleep Apnea  Plan: PT/OT WBAT, CPM 5/hrs day until ROM 0-90 degrees, then D/C CPM DVT Prophylaxis:  SCDx72hrs, ASA 325 mg BID x 2 weeks DISCHARGE PLAN: Home DISCHARGE NEEDS: HHPT, CPM, Walker and 3-in-1 comode seat     Tashon Capp R 05/19/2015, 1:24 PM

## 2015-05-19 NOTE — Progress Notes (Signed)
Physical Therapy Treatment Patient Details Name: James BastosDarrell R Namba MRN: [id number] DOB: 08/01/1955 Today's Date: 05/19/2015    History of Present Illness 59 y.o. male, has a history of pain and functional disability in the left knee due to arthritis. S/p left total knee arthroplasty on 05/18/15.  Pt with significant PMHx of HTN,  R shoulder arthroscopy, nerve and artery repair right index finger.    PT Comments    Pt able to gait train with no increase of pain. Performed additional exercises from HEP program given to him this morning. Stair training will be performed next therapy session to prepare patient for going home.   Follow Up Recommendations  Home health PT;Supervision for mobility/OOB     Equipment Recommendations  Rolling walker with 5" wheels       Precautions / Restrictions Precautions Precautions: Knee Precaution Booklet Issued: Yes (comment) Precaution Comments: knee exercise handout given, precautions reviewed Restrictions Weight Bearing Restrictions: Yes LLE Weight Bearing: Weight bearing as tolerated    Mobility    Transfers Overall transfer level: Needs assistance Equipment used: Rolling walker (2 wheeled) Transfers: Sit to/from Stand Sit to Stand: Min guard;Supervision (min guard to lower LLE to floor)         General transfer comment: Pt requiring supervision and his rolling walker while performing sit to stands.   Ambulation/Gait Ambulation/Gait assistance: Supervision Ambulation Distance (Feet): 150 Feet Assistive device: Rolling walker (2 wheeled) Gait Pattern/deviations: Step-to pattern;Antalgic Gait velocity: decreased Gait velocity interpretation: Below normal speed for age/gender General Gait Details: Pt requiring verbal cuing for heel-toe gait pattern on LLE. Pt had decreased gait speed but increased his speed once returning to the room.                            Cognition Arousal/Alertness: Awake/alert Behavior During  Therapy: WFL for tasks assessed/performed Overall Cognitive Status: Within Functional Limits for tasks assessed                      Exercises Total Joint Exercises Short Arc Quad: AROM;AAROM;Left;10 reps;Seated Hip ABduction/ADduction: AAROM;AROM;Left;15 reps;Seated Straight Leg Raises: AROM;AAROM;Left;10 reps;Seated (Pt able to help during eccentric phase of SLR)        Pertinent Vitals/Pain Pain Assessment: 0-10 Pain Score: 6  Pain Location: L knee Pain Descriptors / Indicators: Aching;Sore Pain Intervention(s): Monitored during session    Home Living  Stairs: 5 to enter, bilateral handrails cannot reach both. One story home.                 Frequency  7X/week         End of Session Equipment Utilized During Treatment: Gait belt Activity Tolerance: Patient tolerated treatment well Patient left: in chair;with call bell/phone within reach     Time: 1526-1550 PT Time Calculation (min) (ACUTE ONLY): 24 min  Charges:  1 Gait 1 TE                    Laurena SlimmerKayli Dove [place], [place]PTA [phone] OFFICE  05/19/2015, 4:18 PM

## 2015-05-19 NOTE — Progress Notes (Signed)
Orthopedic Tech Progress Note Patient Details:  Jethro BastosDarrell R Burpee 01/29/1956 [id number] On cpm at 9:15 pm Patient ID: Jethro BastosDarrell R Shells, male   DOB: 10/05/1955, 59 y.o.   MRN: [id number]   Jennye MoccasinHughes, Mry Lamia Craig 05/19/2015, 9:16 PM

## 2015-05-19 NOTE — Evaluation (Signed)
Physical Therapy Evaluation Patient Details Name: James BastosDarrell R Bruning MRN: [id number] DOB: 02/25/1956 Today's Date: 05/19/2015   History of Present Illness  59 y.o. male, has a history of pain and functional disability in the left knee due to arthritis. S/p left total knee arthroplasty on 05/18/15.  Pt with significant PMHx of HTN,  R shoulder arthroscopy, nerve and artery repair right index finger.  Clinical Impression  Pt is POD #1 and is moving well, min assist overall for gait and mobility. I anticipate he will be able to d/c home with wife's assist and HHPT f/u.   PT to follow acutely for deficits listed below.      Follow Up Recommendations Home health PT;Supervision for mobility/OOB    Equipment Recommendations  Rolling walker with 5" wheels    Recommendations for Other Services   NA    Precautions / Restrictions Precautions Precautions: Knee Precaution Booklet Issued: Yes (comment) Precaution Comments: knee exercise handout given, precautions reviewed Restrictions Weight Bearing Restrictions: Yes LLE Weight Bearing: Weight bearing as tolerated      Mobility  Bed Mobility Overal bed mobility: Needs Assistance Bed Mobility: Supine to Sit     Supine to sit: Min assist     General bed mobility comments: Min assist to help progress left leg over EOB. Verbal cues for 1/2 bridge technique.    Transfers Overall transfer level: Needs assistance Equipment used: Rolling walker (2 wheeled) Transfers: Sit to/from Stand Sit to Stand: Min guard         General transfer comment: Min guard assist to support trunk during transitions. Verbal cues for safe hand placement.   Ambulation/Gait Ambulation/Gait assistance: Min assist Ambulation Distance (Feet): 75 Feet Assistive device: Rolling walker (2 wheeled) Gait Pattern/deviations: Step-to pattern;Antalgic Gait velocity: decreased Gait velocity interpretation: Below normal speed for age/gender General Gait Details: pt with  moderately antalgic gait pattern.  Verbal cues for LE sequencing.          Balance Overall balance assessment: Needs assistance Sitting-balance support: Feet supported;No upper extremity supported Sitting balance-Leahy Scale: Good     Standing balance support: Bilateral upper extremity supported Standing balance-Leahy Scale: Poor Standing balance comment: RW for support                             Pertinent Vitals/Pain Pain Assessment: 0-10 Pain Score: 6  Faces Pain Scale: Hurts even more Pain Location: left knee Pain Descriptors / Indicators: Aching;Guarding Pain Intervention(s): Limited activity within patient's tolerance;Monitored during session;Repositioned    Home Living Family/patient expects to be discharged to:: Private residence Living Arrangements: Spouse/significant other;Children Available Help at Discharge: Family;Available 24 hours/day Type of Home: House         Home Equipment: None      Prior Function Level of Independence: Independent                  Extremity/Trunk Assessment   Upper Extremity Assessment: Defer to OT evaluation           Lower Extremity Assessment: LLE deficits/detail   LLE Deficits / Details: left leg with normal post op pain and weakness.  Ankle at least 3/5, knee 2+/5, hip 2+/5 per gross functional assessment.   Cervical / Trunk Assessment: Normal  Communication   Communication: No difficulties  Cognition Arousal/Alertness: Awake/alert Behavior During Therapy: WFL for tasks assessed/performed Overall Cognitive Status: Within Functional Limits for tasks assessed  General Comments General comments (skin integrity, edema, etc.): Pts SpO2 down to 83 following activity, instructed pt to take deep breaths through nose; SpO2 up in low 90s. Pt reports that he often holds his breath during activity; educated on need for deep breathing during activity; pt verbalized  understanding.    Exercises Total Joint Exercises Ankle Circles/Pumps: AROM;Both;20 reps Quad Sets: AROM;Left;10 reps;Seated Towel Squeeze: AROM;Both;10 reps;Seated Heel Slides: AROM;AAROM;Left;10 reps;Seated      Assessment/Plan    PT Assessment Patient needs continued PT services  PT Diagnosis Difficulty walking;Abnormality of gait;Generalized weakness;Acute pain   PT Problem List Decreased strength;Decreased range of motion;Decreased activity tolerance;Decreased balance;Decreased mobility;Decreased knowledge of use of DME;Decreased knowledge of precautions;Pain  PT Treatment Interventions DME instruction;Gait training;Stair training;Functional mobility training;Therapeutic activities;Therapeutic exercise;Balance training;Neuromuscular re-education;Patient/family education;Modalities;Manual techniques   PT Goals (Current goals can be found in the Care Plan section) Acute Rehab PT Goals Patient Stated Goal: to go home at d/c PT Goal Formulation: With patient Time For Goal Achievement: 05/26/15 Potential to Achieve Goals: Good    Frequency 7X/week    End of Session Equipment Utilized During Treatment: Gait belt Activity Tolerance: Patient limited by pain Patient left: in chair;with call bell/phone within reach           Time: 1042-1117 PT Time Calculation (min) (ACUTE ONLY): 35 min   Charges:   PT Evaluation $Initial PT Evaluation Tier I: 1 Procedure PT Treatments $Gait Training: 8-22 mins        Margrett Kalb B. Mabell Esguerra, PT, DPT [phone]   05/19/2015, 2:55 PM

## 2015-05-19 NOTE — Care Management Note (Signed)
Case Management Note  Patient Details  Name: James BastosDarrell R Dann MRN: [id number] Date of Birth: 11/22/1955  Subjective/Objective:    59 yr old male s/p left total knee arthroplasty.   Action/Plan:  Case manager spoke with patient concerning home health and DME needs at dischage. Choice was offered. [facility]. Patient states his wife will assist at discharge. Rolling walker and 3in1 have been delivered to patient's room, CPM will be delivered to his home. Patient requests that Home Health Agency use his Cell# to contact him: [phone]. Case manager informed Tamera PuntMiranda of this. No further Case Management needs identified.   Expected Discharge Date:   05/20/15       Expected Discharge Plan:   Home with Home Health  In-House Referral:  NA  Discharge planning Services  CM Consult  Post Acute Care Choice:  Home Health, Durable Medical Equipment Choice offered to:  Patient  DME Arranged:  3-N-1, Walker rolling, CPM DME Agency:  [employer]  HH Arranged:  PT HH Agency:  [facility]  Status of Service:  Completed, signed off  Medicare Important Message Given:    Date Medicare IM Given:    Medicare IM give by:    Date Additional Medicare IM Given:    Additional Medicare Important Message give by:     If discussed at Long Length of Stay Meetings, dates discussed:    Additional Comments:  Durenda GuthrieBrady, Tavis Kring Naomi, RN 05/19/2015, 3:17 PM

## 2015-05-19 NOTE — Progress Notes (Signed)
Utilization review completed. Miya Luviano, RN, BSN. 

## 2015-05-20 LAB — CBC
HCT: 33.6 % — ABNORMAL LOW (ref 39.0–52.0)
Hemoglobin: 11.6 g/dL — ABNORMAL LOW (ref 13.0–17.0)
MCH: 32.5 pg (ref 26.0–34.0)
MCHC: 34.5 g/dL (ref 30.0–36.0)
MCV: 94.1 fL (ref 78.0–100.0)
PLATELETS: 139 10*3/uL — AB (ref 150–400)
RBC: 3.57 MIL/uL — ABNORMAL LOW (ref 4.22–5.81)
RDW: 12 % (ref 11.5–15.5)
WBC: 10.3 10*3/uL (ref 4.0–10.5)

## 2015-05-20 NOTE — Progress Notes (Signed)
Physical Therapy Treatment Patient Details Name: James BastosDarrell R Lenahan MRN: [id number] DOB: 06/15/1956 Today's Date: 05/20/2015    History of Present Illness 59 y.o. male, has a history of pain and functional disability in the left knee due to arthritis. S/p left total knee arthroplasty on 05/18/15.  Pt with significant PMHx of HTN,  R shoulder arthroscopy, nerve and artery repair right index finger.    PT Comments    Pt performed stair training during therapy today requiring supervision, displayed signs of being able to ambulate stairs once he return home. Pt stated his wife will be home to help.  Follow Up Recommendations  Home health PT;Supervision for mobility/OOB     Equipment Recommendations  Rolling walker with 5" wheels       Precautions / Restrictions Precautions Precautions: Knee Precaution Booklet Issued: Yes (comment) Restrictions Weight Bearing Restrictions: Yes LLE Weight Bearing: Weight bearing as tolerated    Mobility  Bed Mobility Overal bed mobility: Needs Assistance Bed Mobility: Supine to Sit     Supine to sit: Min assist     General bed mobility comments: Pt required assist to move LLE in bed and lower LLE to floor when sitting EOB  Transfers Overall transfer level: Needs assistance Equipment used: Rolling walker (2 wheeled) Transfers: Sit to/from Stand Sit to Stand: Supervision         General transfer comment: Pt required supervision during transfer. Verbal cuing to push of off sturdy surface instead of grabbing onto walker and pulling up.  Ambulation/Gait Ambulation/Gait assistance: Supervision Ambulation Distance (Feet): 150 Feet Assistive device: Rolling walker (2 wheeled) Gait Pattern/deviations: Step-to pattern;Decreased stance time - left;Antalgic     General Gait Details: Pt required verbal cuing on heel toe sequencing during gait training   Stairs Stairs: Yes Stairs assistance: Supervision Stair Management: One rail Left;One rail  Right;Sideways;Step to pattern Number of Stairs: 3 (x 3 trials) General stair comments: Pt will use left hand rail ascending stairs and right handrail to descend stairs once home. Pt required min guard for first trial and progressed to supervision. Pt required verbal cuing for proper leg sequencing when ascending and descending stairs.           Cognition Arousal/Alertness: Awake/alert Behavior During Therapy: WFL for tasks assessed/performed Overall Cognitive Status: Within Functional Limits for tasks assessed                      Exercises Total Joint Exercises Heel Slides: AROM;AAROM;Left;10 reps;Seated (towel placed under foot to help slide) Long Arc Quad: AROM;AAROM;Left;10 reps;Seated Knee Flexion: AROM;Left;Seated    Goniometry Measurement:  7-70 degrees of L knee flexion   Pertinent Vitals/Pain Pain Assessment: 0-10 Pain Score: 3  Pain Location: Lknee Pain Descriptors / Indicators: Sore Pain Intervention(s): Monitored during session           PT Goals (current goals can now be found in the care plan section) Progress towards PT goals: Progressing toward goals    Frequency  7X/week    PT Plan Current plan remains appropriate       End of Session   Activity Tolerance: Patient tolerated treatment well Patient left: in chair;with call bell/phone within reach     Time: 1112-1140 PT Time Calculation (min) (ACUTE ONLY): 28 min  Charges:    2 Gait                     Laurena SlimmerKayli Louretta Tantillo, [place]PTA [phone] OFFICE  05/20/2015, 11:58 AM

## 2015-05-20 NOTE — Progress Notes (Signed)
Patient ID: James Bullock Schirm, male   DOB: 10/14/1955, 59 y.o.   MRN: [id number] PATIENT ID: James Bullock Gamel  MRN: [id number]  DOB/AGE:  11/29/1955 / 59 y.o.  2 Days Post-Op Procedure(s) (LRB): TOTAL KNEE ARTHROPLASTY (Left)    PROGRESS NOTE Subjective: Patient is alert, oriented, no Nausea, no Vomiting, yes passing gas, no Bowel Movement. Taking PO well. Denies SOB, Chest or Calf Pain. Using Incentive Spirometer, PAS in place. Ambulate 150, CPM 0-60 Patient reports pain as 3 on 0-10 scale  .    Objective: Vital signs in last 24 hours: Filed Vitals:   05/19/15 1300 05/19/15 1530 05/19/15 2241 05/20/15 0657  BP: 118/72 131/71 133/66 132/70  Pulse: 76 94 87   Temp: 98.4 F (36.9 C)  99.3 F (37.4 C) 99.2 F (37.3 C)  TempSrc: Oral  Oral Oral  Resp: 16  16 18   Height:      Weight:      SpO2: 96% 92% 94% 100%      Intake/Output from previous day: I/O last 3 completed shifts: In: 1655.4 [P.O.:720; I.V.:935.4] Out: 1400 [Urine:1400]   Intake/Output this shift:     LABORATORY DATA:  Recent Labs  05/19/15 0548 05/20/15 0348  WBC 9.6 10.3  HGB 12.4* 11.6*  HCT 36.2* 33.6*  PLT 151 139*  NA 134*  --   K 4.2  --   CL 103  --   CO2 23  --   BUN 13  --   CREATININE 0.98  --   GLUCOSE 180*  --   CALCIUM 8.5*  --     Examination: Neurologically intact ABD soft Neurovascular intact Sensation intact distally Intact pulses distally Dorsiflexion/Plantar flexion intact Incision: dressing C/D/I No cellulitis present Compartment soft}  Assessment:   2 Days Post-Op Procedure(s) (LRB): TOTAL KNEE ARTHROPLASTY (Left) ADDITIONAL DIAGNOSIS: Expected Acute Blood Loss Anemia, Hypertension  Plan: PT/OT WBAT, CPM 5/hrs day until ROM 0-90 degrees, then D/C CPM DVT Prophylaxis:  SCDx72hrs, ASA 325 mg BID x 2 weeks DISCHARGE PLAN: Home, today after PT DISCHARGE NEEDS: HHPT, CPM, Walker and 3-in-1 comode seat     Linzi Ohlinger J 05/20/2015, 7:01 AM

## 2015-05-20 NOTE — Discharge Summary (Signed)
Patient ID: James Bullock Guettler MRN: [id number] DOB/AGE: 60/08/1955 60 y.o.  Admit date: 05/18/2015 Discharge date: 05/20/2015  Admission Diagnoses:  Principal Problem:   Primary osteoarthritis of left knee Active Problems:   Arthritis of knee   Discharge Diagnoses:  Same  Past Medical History  Diagnosis Date  . Hyperlipemia     takes Crestor daily  . Arthritis     knees  . Sleep apnea     uses CPAP nightly  . Hypertension     takes Losartan daily  . History of colon polyps     benign  . Joint pain   . Diverticulosis     Surgeries: Procedure(s): TOTAL KNEE ARTHROPLASTY on 05/18/2015   Consultants:    Discharged Condition: Improved  Hospital Course: James Bullock Marcelli is an 59 y.o. male who was admitted 05/18/2015 for operative treatment ofPrimary osteoarthritis of left knee. Patient has severe unremitting pain that affects sleep, daily activities, and work/hobbies. After pre-op clearance the patient was taken to the operating room on 05/18/2015 and underwent  Procedure(s): TOTAL KNEE ARTHROPLASTY.    Patient was given perioperative antibiotics: Anti-infectives    Start     Dose/Rate Route Frequency Ordered Stop   05/18/15 1430  cefUROXime (ZINACEF) injection  Status:  Discontinued       As needed 05/18/15 1430 05/18/15 1544   05/18/15 1230  ceFAZolin (ANCEF) IVPB 2 g/50 mL premix     2 g 100 mL/hr over 30 Minutes Intravenous To ShortStay Surgical 05/17/15 1421 05/18/15 1402       Patient was given sequential compression devices, early ambulation, and chemoprophylaxis to prevent DVT.  Patient benefited maximally from hospital stay and there were no complications.    Recent vital signs: Patient Vitals for the past 24 hrs:  BP Temp Temp src Pulse Resp SpO2  05/20/15 0657 132/70 mmHg 99.2 F (37.3 C) Oral - 18 100 %  05/19/15 2241 133/66 mmHg 99.3 F (37.4 C) Oral 87 16 94 %  05/19/15 1530 131/71 mmHg - - 94 - 92 %  05/19/15 1300 118/72 mmHg 98.4 F (36.9 C) Oral 76 16  96 %  05/19/15 1043 123/76 mmHg - - 88 - 96 %     Recent laboratory studies:  Recent Labs  05/19/15 0548 05/20/15 0348  WBC 9.6 10.3  HGB 12.4* 11.6*  HCT 36.2* 33.6*  PLT 151 139*  NA 134*  --   K 4.2  --   CL 103  --   CO2 23  --   BUN 13  --   CREATININE 0.98  --   GLUCOSE 180*  --   CALCIUM 8.5*  --      Discharge Medications:     Medication List    STOP taking these medications        meloxicam 15 MG tablet  Commonly known as:  MOBIC      TAKE these medications        acetaminophen 650 MG CR tablet  Commonly known as:  TYLENOL  Take 1,300 mg by mouth every 8 (eight) hours as needed for pain.     ANDROGEL 20.25 MG/1.25GM (1.62%) Gel  Generic drug:  Testosterone  Place 1 Squirt onto the skin daily.     aspirin EC 325 MG tablet  Take 1 tablet (325 mg total) by mouth 2 (two) times daily.     CINNAMON PO  Take 2 capsules by mouth daily.     Fish Oil 1200 MG Caps  Take 1 capsule by mouth daily.     FLAXSEED OIL PO  Take 1,400 mg by mouth daily.     losartan 100 MG tablet  Commonly known as:  COZAAR  Take 100 mg by mouth daily.     methocarbamol 500 MG tablet  Commonly known as:  ROBAXIN  Take 1 tablet (500 mg total) by mouth 2 (two) times daily with a meal.     multivitamin with minerals Tabs tablet  Take 1 tablet by mouth daily.     oxyCODONE-acetaminophen 5-325 MG tablet  Commonly known as:  ROXICET  Take 1 tablet by mouth every 4 (four) hours as needed.     rosuvastatin 10 MG tablet  Commonly known as:  CRESTOR  Take 10 mg by mouth daily.     vitamin C 500 MG tablet  Commonly known as:  ASCORBIC ACID  Take 500 mg by mouth daily.        Diagnostic Studies: Dg Chest 2 View  05/06/2015  CLINICAL DATA:  Knee replacement.  Hypertension. EXAM: CHEST  2 VIEW COMPARISON:  None. FINDINGS: The heart size and mediastinal contours are within normal limits. Both lungs are clear. The visualized skeletal structures are unremarkable. IMPRESSION:  No active cardiopulmonary disease. Electronically Signed   By: Maisie Fus  Register   On: 05/06/2015 09:10    Disposition: 01-Home or Self Care      Discharge Instructions    CPM    Complete by:  As directed   Continuous passive motion machine (CPM):      Use the CPM from 0 to 50 for 4 hours per day.      You may increase by 10 per day.  You may break it up into 2 or 3 sessions per day.      Use CPM for 1-2 weeks or until you are told to stop.     Call MD / Call 911    Complete by:  As directed   If you experience chest pain or shortness of breath, CALL 911 and be transported to the hospital emergency room.  If you develope a fever above 101 F, pus (white drainage) or increased drainage or redness at the wound, or calf pain, call your surgeon's office.     Change dressing    Complete by:  As directed   PRN     Constipation Prevention    Complete by:  As directed   Drink plenty of fluids.  Prune juice may be helpful.  You may use a stool softener, such as Colace (over the counter) 100 mg twice a day.  Use MiraLax (over the counter) for constipation as needed.     Diet - low sodium heart healthy    Complete by:  As directed      Increase activity slowly as tolerated    Complete by:  As directed            Follow-up Information    Follow up with Nestor Lewandowsky, MD In 2 weeks.   Specialty:  Orthopedic Surgery   Contact information:   [address] [place] [address] [phone]       Follow up with [facility].   Why:  Someone from [facility] will contact you concerning start date and time for therapy.   Contact information:   [address] [place] [address] [phone]       Follow up with Nestor Lewandowsky, MD.   Specialty:  Orthopedic  Surgery   Contact information:   [address] [place] [address] [phone]       Follow up with Nestor Lewandowsky, MD In 1 week.   Specialty:  Orthopedic Surgery   Contact information:   [address] [place] [address] [phone]        Signed: Nestor Lewandowsky 05/20/2015, 7:07 AM

## 2015-06-03 NOTE — Anesthesia Postprocedure Evaluation (Signed)
  Anesthesia Post-op Note  Patient: James Bullock  Procedure(s) Performed: Procedure(s): TOTAL KNEE ARTHROPLASTY (Left)  Patient Location: PACU  Anesthesia Type:MAC and Spinal  Level of Consciousness: awake, alert  and oriented  Airway and Oxygen Therapy: Patient Spontanous Breathing and Patient connected to nasal cannula oxygen  Post-op Pain: mild  Post-op Assessment: Post-op Vital signs reviewed, Patient's Cardiovascular Status Stable, Respiratory Function Stable, Patent Airway and Pain level controlled LLE Motor Response: Purposeful movement LLE Sensation: Full sensation          Post-op Vital Signs: stable  Last Vitals:  Filed Vitals:   05/20/15 1243  BP: 112/64  Pulse: 94  Temp: 36.9 C  Resp: 20    Complications: No apparent anesthesia complications

## 2015-07-17 HISTORY — PX: TOTAL KNEE ARTHROPLASTY: SHX125

## 2015-11-29 ENCOUNTER — Encounter: Payer: BLUE CROSS/BLUE SHIELD | Attending: Family Medicine | Admitting: Dietician

## 2015-11-29 ENCOUNTER — Encounter: Payer: Self-pay | Admitting: Dietician

## 2015-11-29 VITALS — Ht 71.0 in | Wt 232.0 lb

## 2015-11-29 DIAGNOSIS — E119 Type 2 diabetes mellitus without complications: Secondary | ICD-10-CM | POA: Diagnosis not present

## 2015-11-29 NOTE — Patient Instructions (Addendum)
Follow a low sodium meal plan with increased amounts of vegetables and fruit.  Continue to stay active.  Aim for 30 minutes most days. Aim for 3-4 Carb Choices per meal (45-60 grams) +/- 1 either way  Aim for 0-1 Carbs per snack if hungry  Include protein in moderation with your meals and snacks Consider reading food labels for Total Carbohydrate and Fat Grams of foods Consider the Calorie [employer] app

## 2015-11-29 NOTE — Progress Notes (Signed)
Diabetes Self-Management Education  Visit Type: First/Initial  Appt. Start Time: 1530 Appt. End Time: 1700  11/29/2015  [name], identified by name and date of birth, is a 60 y.o. male with a diagnosis of Diabetes: Type 2.   Patient came with records of his HgbA1C for the past several years.  His HgbA1C was 6.7% in July 2012 and through exercise and watching what he ate, he reduced this to 5.7%.  He documented times that it was elevated and events such as increased stress and inability to exercise.  He has been motivated since his most recent HgbA1C of 6.7% and has lost from 232 lbs to 225 lbs.  His weight today was higher due to time of day and clothing and was 226 this am at home.  Other hx includes HTN, hyperlipidemia and OSA and uses c-pap.    He lives with his wife and 97 yo daughter.  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  [place] and sees and improvement in his blood sugar when he is exercising more.  He has also noted a decrease in his blood pressure since resuming exercise, losing weight, and increasing his vegetable intake.  ASSESSMENT  Height  (1.803 m), weight 232 lb (105.235 kg). Body mass index is 32.37 kg/(m^2).      Diabetes Self-Management Education - 11/29/15 1543    Visit Information   Visit Type First/Initial   Initial Visit   Diabetes Type Type 2   Are you currently following a meal plan? Yes   What type of meal plan do you follow? decreasing carbohydrates   Are you taking your medications as prescribed? Not on Medications   Date Diagnosed 10/14/2015   Health Coping   How would you rate your overall health? Good   Psychosocial Assessment   Patient Belief/Attitude about Diabetes Motivated to manage diabetes   Self-care barriers None   Self-management support Doctor's office   Other persons present Patient   Patient Concerns Nutrition/Meal planning;Glycemic Control;Other (comment)   supplements   Special Needs None   Preferred Learning Style No preference indicated   Learning Readiness Ready   How often do you need to have someone help you when you read instructions, pamphlets, or other written materials from your doctor or pharmacy? 1 - Never   What is the last grade level you completed in school? 4 years college   Pre-Education Assessment   Patient understands the diabetes disease and treatment process. Needs Review   Patient understands incorporating nutritional management into lifestyle. Needs Review   Patient undertands incorporating physical activity into lifestyle. Demonstrates understanding / competency   Patient understands monitoring blood glucose, interpreting and using results Needs Review   Patient understands prevention, detection, and treatment of acute complications. Needs Review   Patient understands prevention, detection, and treatment of chronic complications. Needs Review   Patient understands how to develop strategies to address psychosocial issues. Demonstrates understanding / competency   Patient understands how to develop strategies to promote health/change behavior. Demonstrates understanding / competency   Complications   Last HgB A1C per patient/outside source 6.8 %  10/14/15   How often do you check your blood sugar? Not recommended by provider   Have you had a dilated eye exam in the past 12 months? Yes   Have you had a dental exam in the past 12 months? Yes   Are you checking your feet? No  Dietary Intake   Breakfast 2 scambled eggs, coffee with truvia and whole milk   Snack (morning) pistacious or almonds and [place]   Lunch salad ([employer] Mediteranean or Chick Filey or [employer]) with grilled chicken and vinegrette OR cafeteria vegetable plate   Snack (afternoon) occasional popcorn   Dinner asparagus, pinto beans, corn muffins OR salmon, asparagus, field peas, salad OR veggie pizza OR occasional hamburgers   Snack (evening) yogurt  with blueberries and walnuts OR cashew, pumpkin seed squares OR nuts and 1 Dove dark Chocolate   Beverage(s) coffee with whole milk adn truvia, [place] diet green tea, or tea sweetened with splenda, mostly water   Exercise   Exercise Type Moderate (swimming / aerobic walking)  [employer] 4 days per week- elyptical for 30 minutes plus 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   How many days per week to you exercise? 6   How many minutes per day do you exercise? 60   Total minutes per week of exercise 360   Patient Education   Previous Diabetes Education Yes (please comment)  2012 when A1C was initially high   Disease state  Definition of diabetes, type 1 and 2, and the diagnosis of diabetes;Factors that contribute to the development of diabetes;Explored patient's options for treatment of their diabetes   Nutrition management  Role of diet in the treatment of diabetes and the relationship between the three main macronutrients and blood glucose level;Food label reading, portion sizes and measuring food.;Meal options for control of blood glucose level and chronic complications.   Physical activity and exercise  Role of exercise on diabetes management, blood pressure control and cardiac health.   Monitoring Identified appropriate SMBG and/or A1C goals.   Chronic complications Relationship between chronic complications and blood glucose control;Assessed and discussed foot care and prevention of foot problems;Dental care;Retinopathy and reason for yearly dilated eye exams;Lipid levels, blood glucose control and heart disease   Psychosocial adjustment Worked with patient to identify barriers to care and solutions;Role of stress on diabetes;Identified and addressed patients feelings and concerns about diabetes   Individualized Goals (developed by patient)   Nutrition General guidelines for healthy choices and portions discussed   Physical Activity Exercise 5-7 days per week;30 minutes per day    Medications Not Applicable   Monitoring  Not Applicable   Reducing Risk increase portions of nuts and seeds   Health Coping Not Applicable   Post-Education Assessment   Patient understands the diabetes disease and treatment process. Demonstrates understanding / competency   Patient understands incorporating nutritional management into lifestyle. Demonstrates understanding / competency   Patient undertands incorporating physical activity into lifestyle. Demonstrates understanding / competency   Patient understands using medications safely. Demonstrates understanding / competency   Patient understands monitoring blood glucose, interpreting and using results Demonstrates understanding / competency   Patient understands prevention, detection, and treatment of acute complications. Demonstrates understanding / competency   Patient understands prevention, detection, and treatment of chronic complications. Demonstrates understanding / competency   Patient understands how to develop strategies to address psychosocial issues. Demonstrates understanding / competency   Patient understands how to develop strategies to promote health/change behavior. Demonstrates understanding / competency   Outcomes   Expected Outcomes Demonstrated interest in learning. Expect positive outcomes   Future DMSE PRN   Program Status Completed      Individualized Plan for Diabetes Self-Management Training:   Learning Objective:  Patient will have a greater understanding of diabetes self-management. Patient education plan is to attend  individual and/or group sessions per assessed needs and concerns.   Plan:   Patient Instructions  Follow a low sodium meal plan with increased amounts of vegetables and fruit.  Continue to stay active.  Aim for 30 minutes most days. Aim for 3-4 Carb Choices per meal (45-60 grams) +/- 1 either way  Aim for 0-1 Carbs per snack if hungry  Include protein in moderation with your meals and  snacks Consider reading food labels for Total Carbohydrate and Fat Grams of foods Consider the Calorie Brooke Dare app  Expected Outcomes:  Demonstrated interest in learning. Expect positive outcomes  Education material provided: Living Well with Diabetes, Food label handouts, A1C conversion sheet, Meal plan card, My Plate, Snack sheet and Support group flyer  If problems or questions, patient to contact team via:  Phone and Email  Future DSME appointment: PRN

## 2016-02-03 IMAGING — CR DG KNEE COMPLETE 4+V*R*
4 series · 4 of 4 positions shown · non-contrast
Comparison: None.

CLINICAL DATA: Bilateral knee pain

EXAM:
RIGHT KNEE - COMPLETE 4+ VIEW

[view not recorded (1 of 4)]
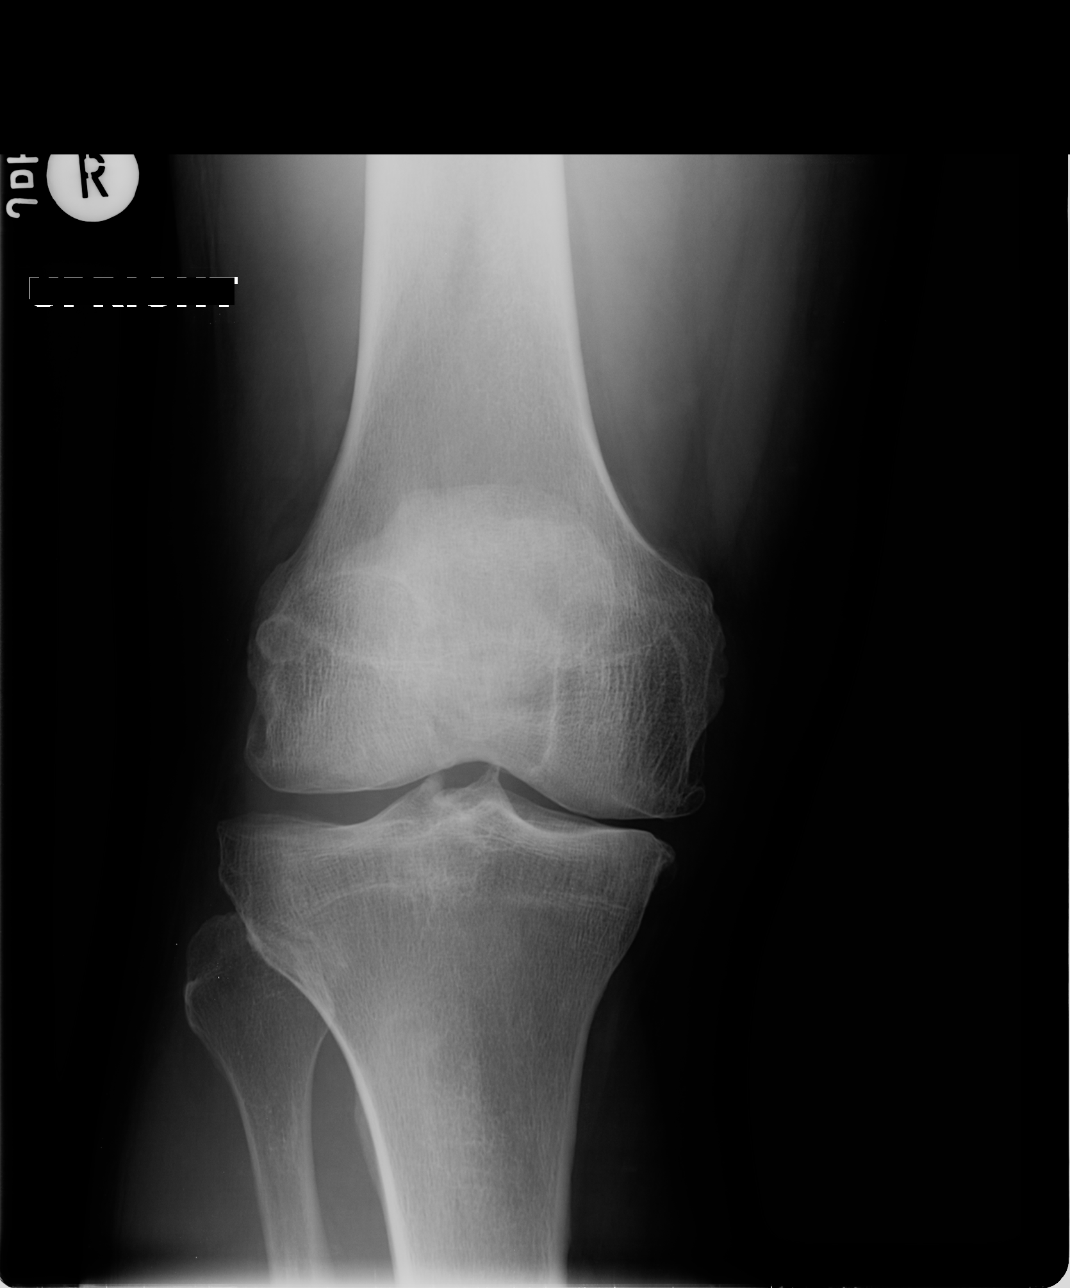

[view not recorded (2 of 4)]
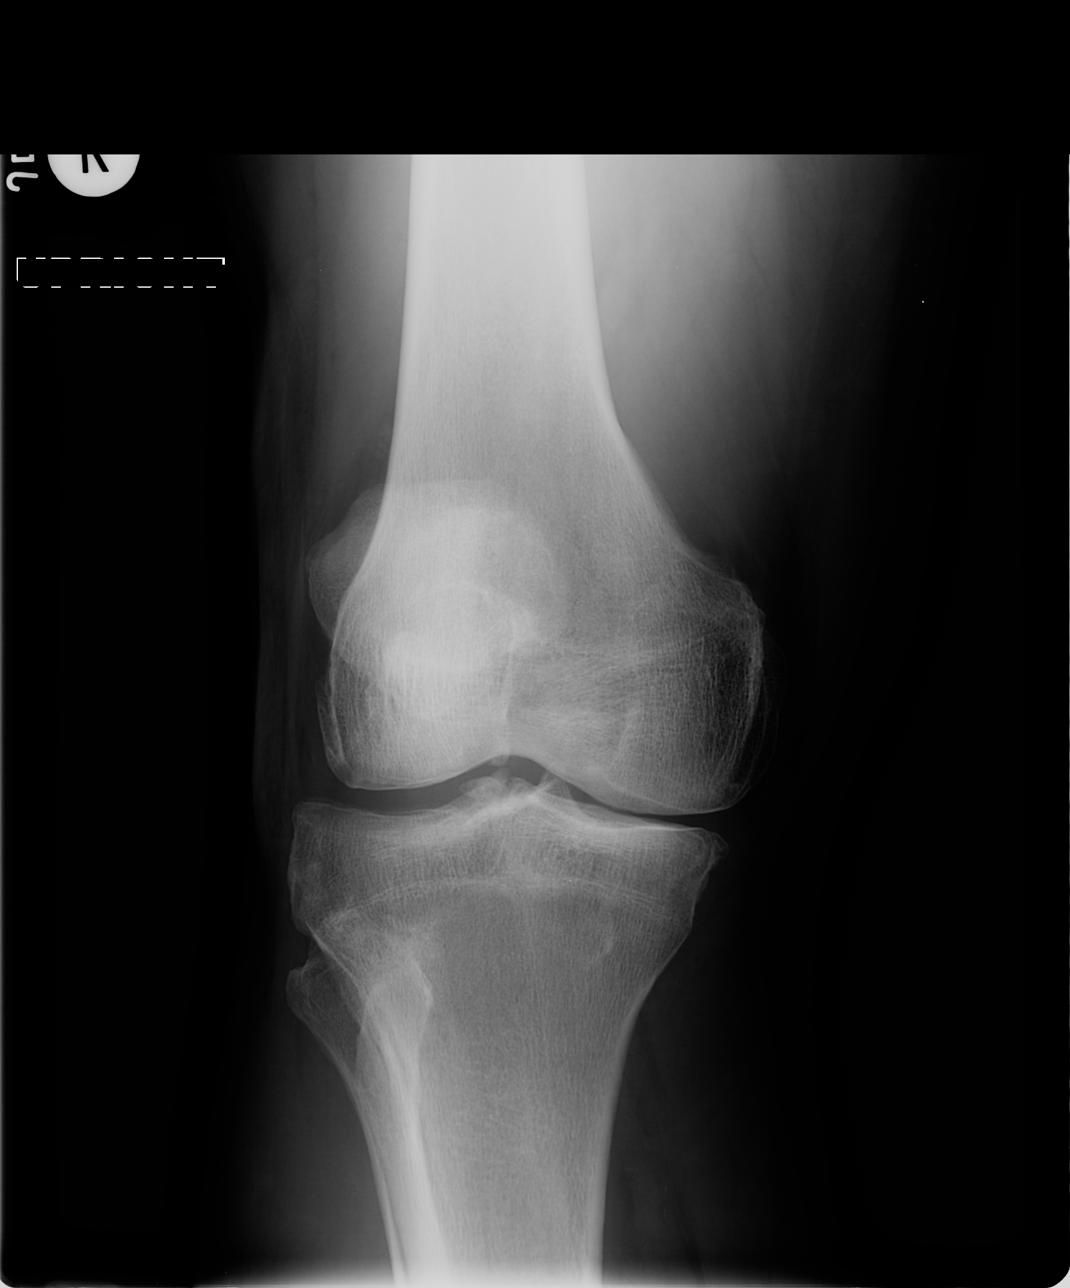

[view not recorded (3 of 4)]
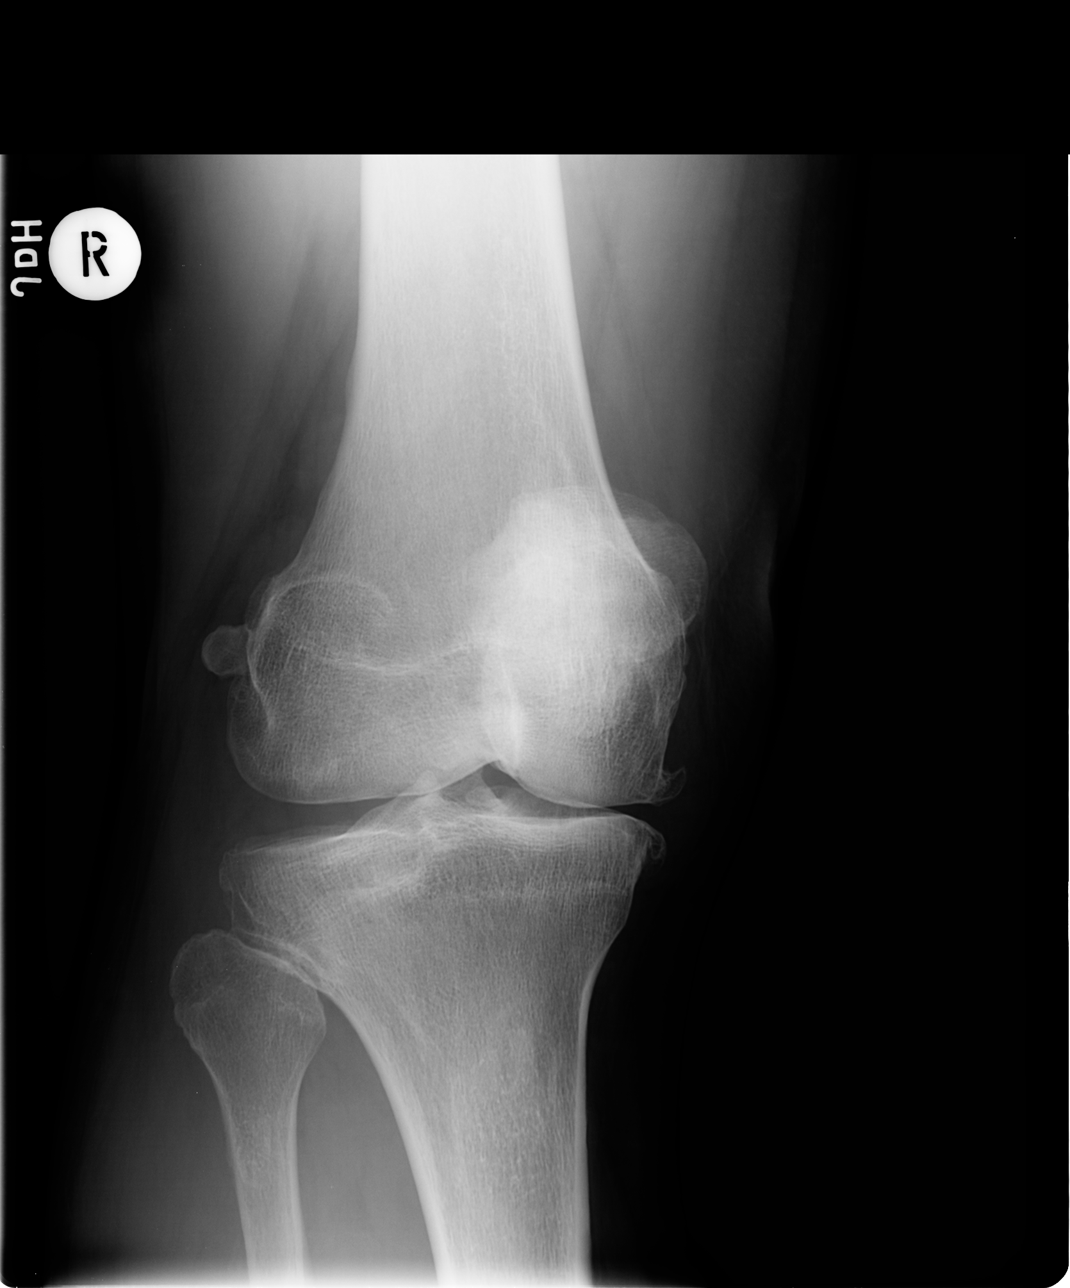

[view not recorded (4 of 4)]
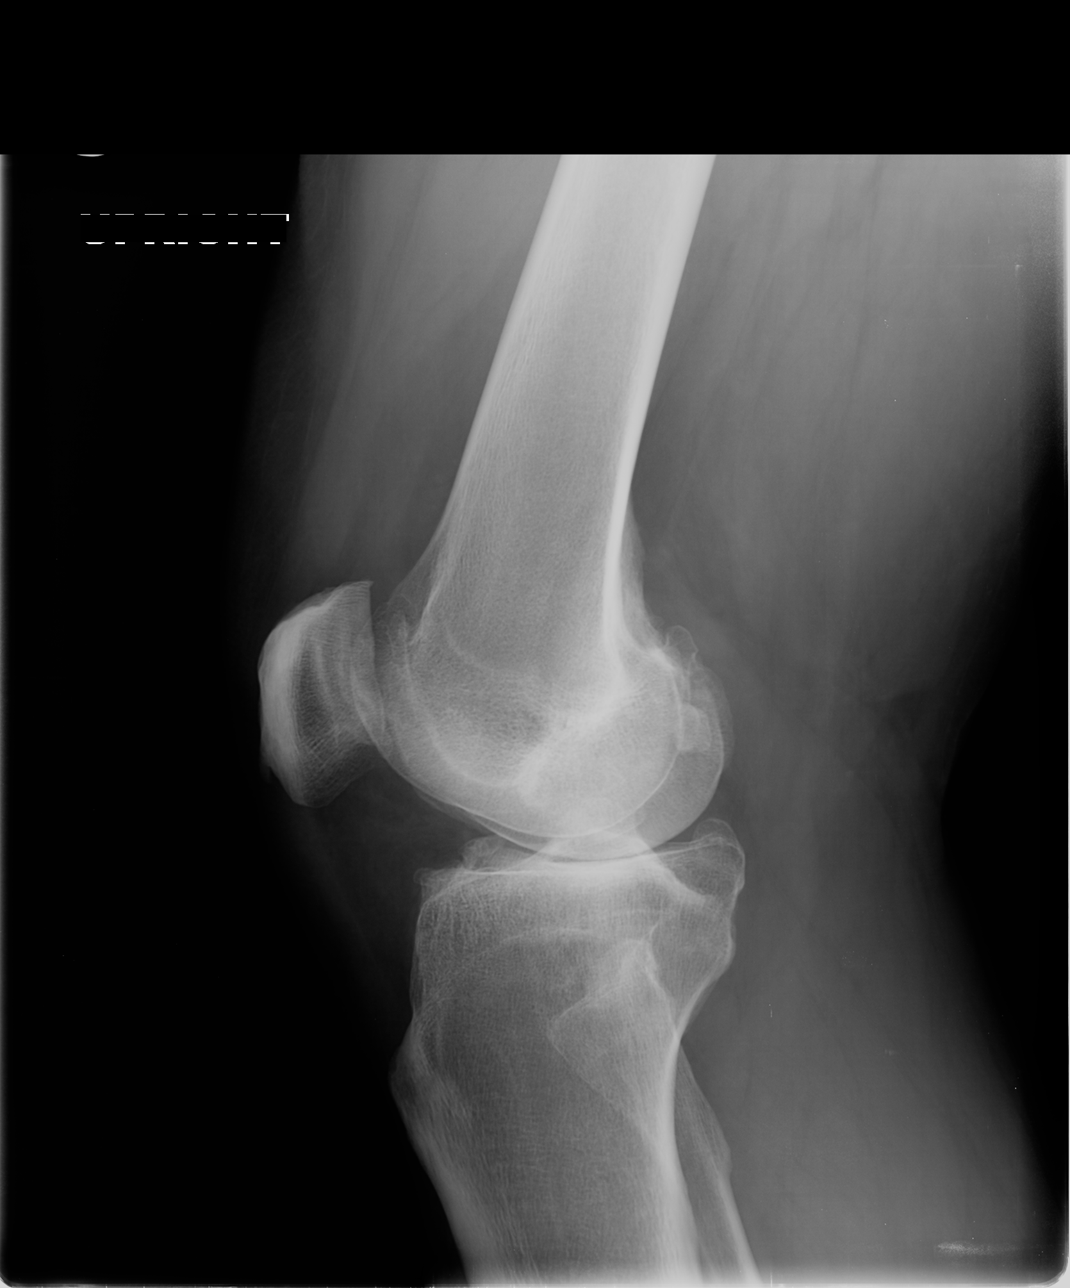

[4 of 4 positions shown; findings below may reference images not displayed]

FINDINGS: Moderate degenerative change in the medial compartment with joint
space narrowing and spurring. Multiple calcified loose bodies in the
joint. Lateral joint space intact. Patellofemoral joint shows mild
irregularity. Small joint effusion.

Negative for fracture.
IMPRESSION: Moderate osteoarthritis in the medial joint compartment. Calcified
loose bodies.

## 2016-02-03 IMAGING — CR DG KNEE COMPLETE 4+V*L*
4 series · 4 of 4 positions shown · non-contrast
Comparison: None.

CLINICAL DATA: Chronic bilateral knee pain

EXAM:
LEFT KNEE - COMPLETE 4+ VIEW

[view not recorded (1 of 4)]
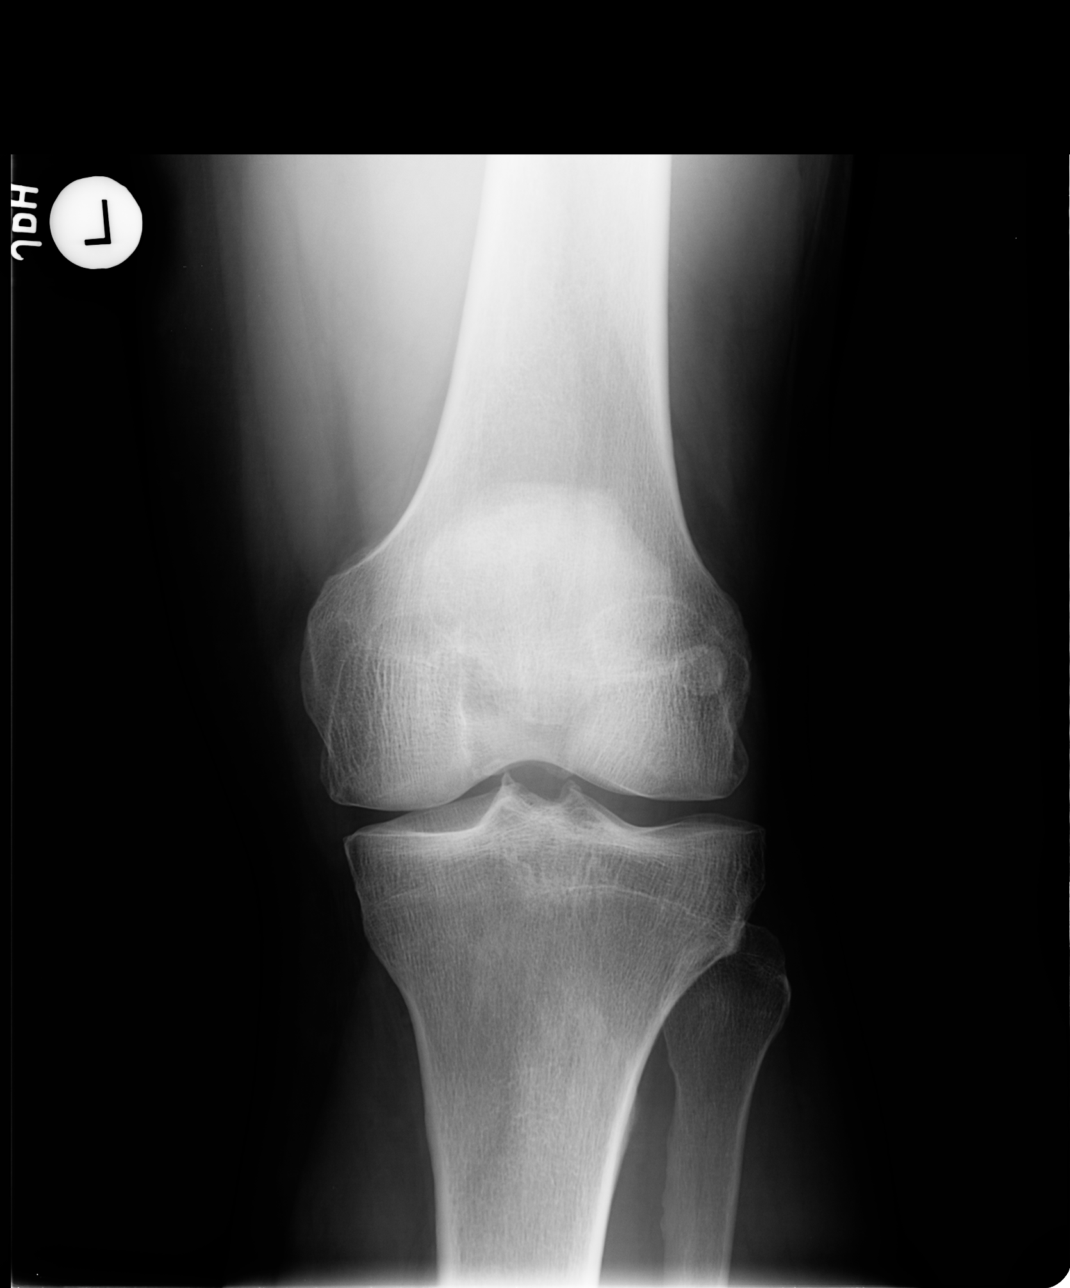

[view not recorded (2 of 4)]
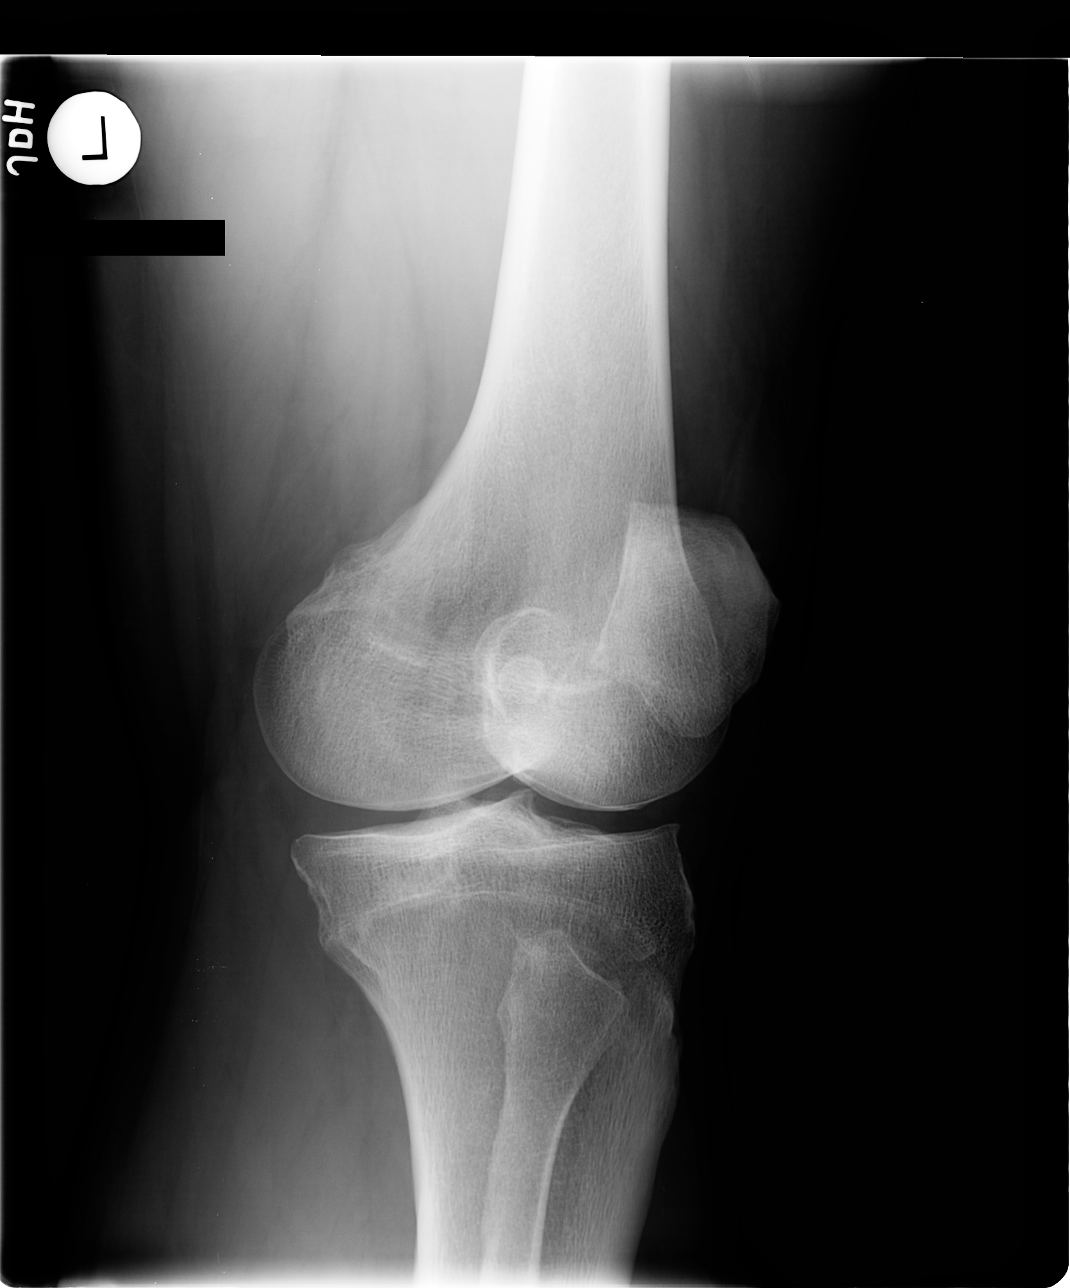

[view not recorded (3 of 4)]
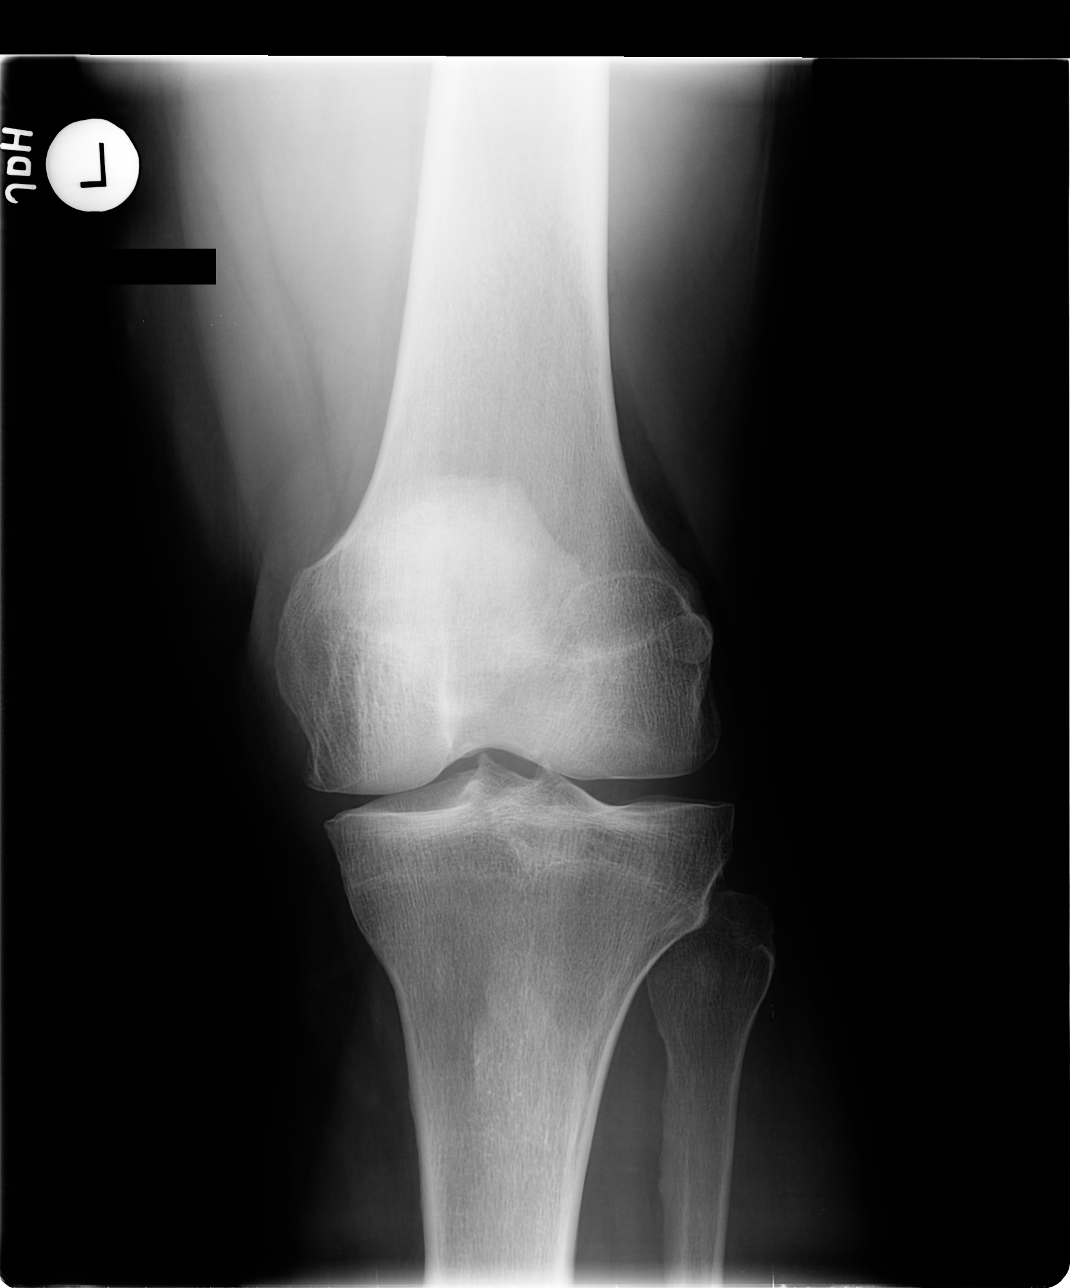

[view not recorded (4 of 4)]
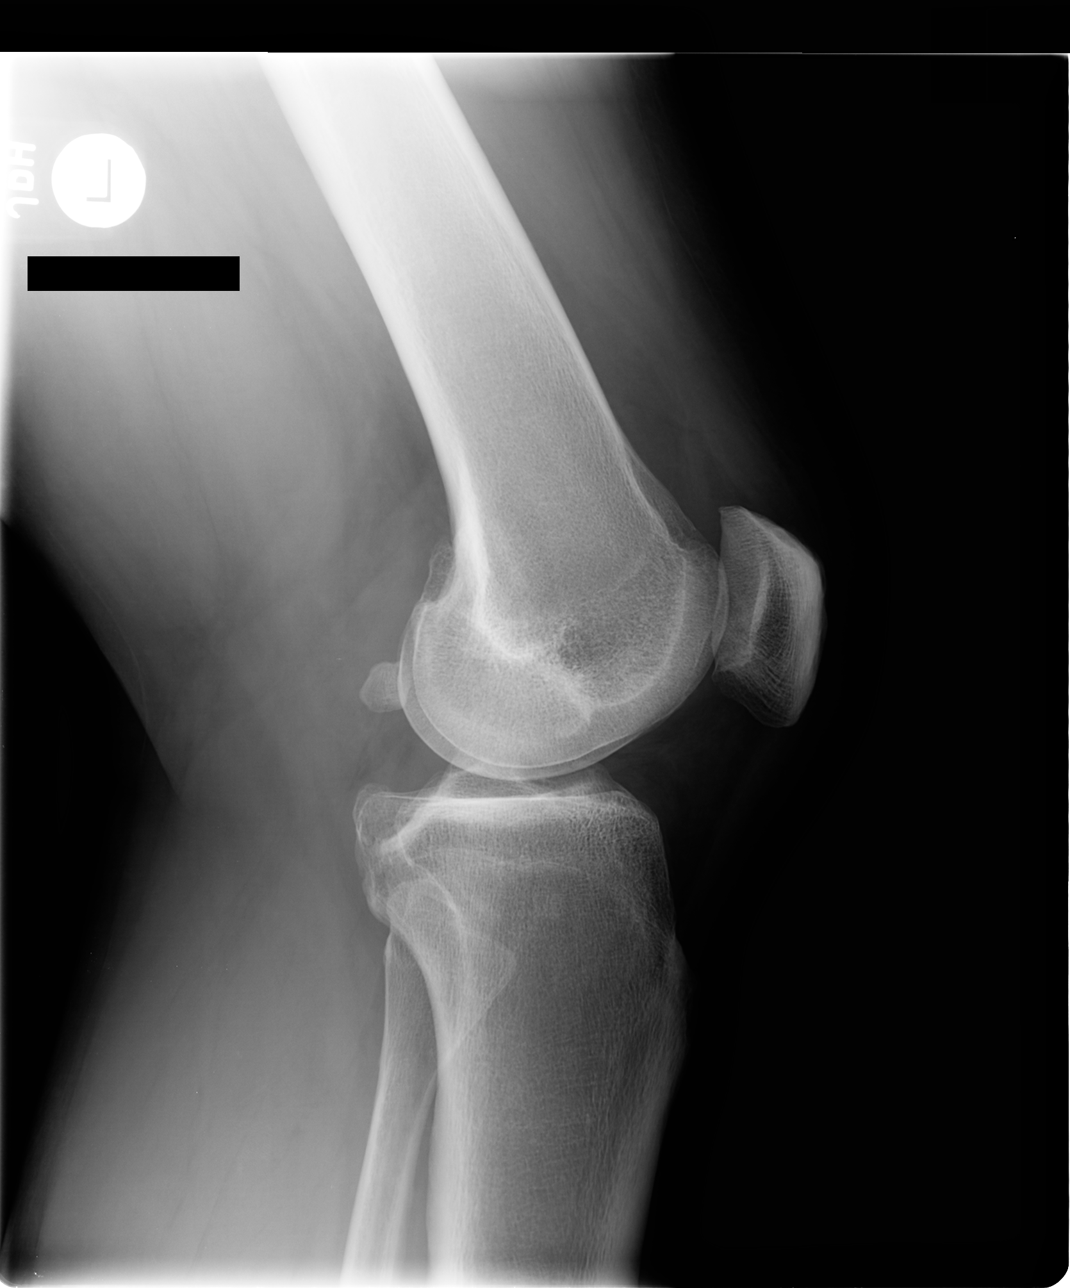

[4 of 4 positions shown; findings below may reference images not displayed]

FINDINGS: Four views of the left knee submitted. No acute fracture or
subluxation. No radiopaque foreign body. No joint effusion.
IMPRESSION: Negative.

## 2017-07-16 HISTORY — PX: TOTAL HIP ARTHROPLASTY: SHX124

## 2017-07-24 DIAGNOSIS — Z01812 Encounter for preprocedural laboratory examination: Secondary | ICD-10-CM | POA: Diagnosis not present

## 2017-07-26 DIAGNOSIS — I10 Essential (primary) hypertension: Secondary | ICD-10-CM | POA: Diagnosis not present

## 2017-07-26 DIAGNOSIS — E119 Type 2 diabetes mellitus without complications: Secondary | ICD-10-CM | POA: Diagnosis not present

## 2017-07-29 ENCOUNTER — Inpatient Hospital Stay: Admit: 2017-07-29 | Payer: BLUE CROSS/BLUE SHIELD | Admitting: Orthopedic Surgery

## 2017-07-29 SURGERY — ARTHROPLASTY, KNEE, TOTAL
Anesthesia: Spinal | Laterality: Right

## 2017-07-30 DIAGNOSIS — M1711 Unilateral primary osteoarthritis, right knee: Secondary | ICD-10-CM | POA: Diagnosis not present

## 2017-07-30 DIAGNOSIS — G4733 Obstructive sleep apnea (adult) (pediatric): Secondary | ICD-10-CM | POA: Diagnosis not present

## 2017-08-02 DIAGNOSIS — M1711 Unilateral primary osteoarthritis, right knee: Secondary | ICD-10-CM | POA: Diagnosis not present

## 2017-08-02 DIAGNOSIS — M1612 Unilateral primary osteoarthritis, left hip: Secondary | ICD-10-CM | POA: Diagnosis not present

## 2017-08-02 DIAGNOSIS — M21161 Varus deformity, not elsewhere classified, right knee: Secondary | ICD-10-CM | POA: Diagnosis not present

## 2017-08-07 DIAGNOSIS — M1711 Unilateral primary osteoarthritis, right knee: Secondary | ICD-10-CM | POA: Diagnosis not present

## 2017-08-14 DIAGNOSIS — Z9889 Other specified postprocedural states: Secondary | ICD-10-CM | POA: Diagnosis not present

## 2017-08-14 DIAGNOSIS — M1711 Unilateral primary osteoarthritis, right knee: Secondary | ICD-10-CM | POA: Diagnosis not present

## 2017-08-14 DIAGNOSIS — Z96651 Presence of right artificial knee joint: Secondary | ICD-10-CM | POA: Diagnosis not present

## 2017-08-16 DIAGNOSIS — M1711 Unilateral primary osteoarthritis, right knee: Secondary | ICD-10-CM | POA: Diagnosis not present

## 2017-08-20 DIAGNOSIS — M1711 Unilateral primary osteoarthritis, right knee: Secondary | ICD-10-CM | POA: Diagnosis not present

## 2017-08-22 DIAGNOSIS — M1711 Unilateral primary osteoarthritis, right knee: Secondary | ICD-10-CM | POA: Diagnosis not present

## 2017-08-26 DIAGNOSIS — M1711 Unilateral primary osteoarthritis, right knee: Secondary | ICD-10-CM | POA: Diagnosis not present

## 2017-08-28 DIAGNOSIS — M1711 Unilateral primary osteoarthritis, right knee: Secondary | ICD-10-CM | POA: Diagnosis not present

## 2017-09-02 DIAGNOSIS — M1711 Unilateral primary osteoarthritis, right knee: Secondary | ICD-10-CM | POA: Diagnosis not present

## 2017-09-05 DIAGNOSIS — Z96651 Presence of right artificial knee joint: Secondary | ICD-10-CM | POA: Diagnosis not present

## 2017-09-05 DIAGNOSIS — M1711 Unilateral primary osteoarthritis, right knee: Secondary | ICD-10-CM | POA: Diagnosis not present

## 2017-09-05 DIAGNOSIS — Z471 Aftercare following joint replacement surgery: Secondary | ICD-10-CM | POA: Diagnosis not present

## 2017-09-05 DIAGNOSIS — M25561 Pain in right knee: Secondary | ICD-10-CM | POA: Diagnosis not present

## 2017-09-09 DIAGNOSIS — M1711 Unilateral primary osteoarthritis, right knee: Secondary | ICD-10-CM | POA: Diagnosis not present

## 2017-09-11 DIAGNOSIS — M1711 Unilateral primary osteoarthritis, right knee: Secondary | ICD-10-CM | POA: Diagnosis not present

## 2017-10-17 DIAGNOSIS — Z96651 Presence of right artificial knee joint: Secondary | ICD-10-CM | POA: Diagnosis not present

## 2017-10-17 DIAGNOSIS — M25561 Pain in right knee: Secondary | ICD-10-CM | POA: Diagnosis not present

## 2017-10-17 DIAGNOSIS — Z471 Aftercare following joint replacement surgery: Secondary | ICD-10-CM | POA: Diagnosis not present

## 2017-11-07 DIAGNOSIS — I10 Essential (primary) hypertension: Secondary | ICD-10-CM | POA: Diagnosis not present

## 2017-11-07 DIAGNOSIS — E291 Testicular hypofunction: Secondary | ICD-10-CM | POA: Diagnosis not present

## 2017-11-07 DIAGNOSIS — E782 Mixed hyperlipidemia: Secondary | ICD-10-CM | POA: Diagnosis not present

## 2017-11-07 DIAGNOSIS — E1169 Type 2 diabetes mellitus with other specified complication: Secondary | ICD-10-CM | POA: Diagnosis not present

## 2017-11-21 DIAGNOSIS — M25552 Pain in left hip: Secondary | ICD-10-CM | POA: Diagnosis not present

## 2017-12-19 DIAGNOSIS — B351 Tinea unguium: Secondary | ICD-10-CM | POA: Diagnosis not present

## 2017-12-30 DIAGNOSIS — F4323 Adjustment disorder with mixed anxiety and depressed mood: Secondary | ICD-10-CM | POA: Diagnosis not present

## 2018-01-06 DIAGNOSIS — F4323 Adjustment disorder with mixed anxiety and depressed mood: Secondary | ICD-10-CM | POA: Diagnosis not present

## 2018-01-13 DIAGNOSIS — F4323 Adjustment disorder with mixed anxiety and depressed mood: Secondary | ICD-10-CM | POA: Diagnosis not present

## 2018-01-20 DIAGNOSIS — F4323 Adjustment disorder with mixed anxiety and depressed mood: Secondary | ICD-10-CM | POA: Diagnosis not present

## 2018-01-23 DIAGNOSIS — Z96651 Presence of right artificial knee joint: Secondary | ICD-10-CM | POA: Diagnosis not present

## 2018-01-23 DIAGNOSIS — M25561 Pain in right knee: Secondary | ICD-10-CM | POA: Diagnosis not present

## 2018-01-23 DIAGNOSIS — Z09 Encounter for follow-up examination after completed treatment for conditions other than malignant neoplasm: Secondary | ICD-10-CM | POA: Diagnosis not present

## 2018-02-03 DIAGNOSIS — F4323 Adjustment disorder with mixed anxiety and depressed mood: Secondary | ICD-10-CM | POA: Diagnosis not present

## 2018-02-17 DIAGNOSIS — F4323 Adjustment disorder with mixed anxiety and depressed mood: Secondary | ICD-10-CM | POA: Diagnosis not present

## 2018-03-03 DIAGNOSIS — F4323 Adjustment disorder with mixed anxiety and depressed mood: Secondary | ICD-10-CM | POA: Diagnosis not present

## 2018-03-06 DIAGNOSIS — E119 Type 2 diabetes mellitus without complications: Secondary | ICD-10-CM | POA: Diagnosis not present

## 2018-03-24 DIAGNOSIS — F4323 Adjustment disorder with mixed anxiety and depressed mood: Secondary | ICD-10-CM | POA: Diagnosis not present

## 2018-04-07 DIAGNOSIS — F4323 Adjustment disorder with mixed anxiety and depressed mood: Secondary | ICD-10-CM | POA: Diagnosis not present

## 2018-04-15 DIAGNOSIS — F4323 Adjustment disorder with mixed anxiety and depressed mood: Secondary | ICD-10-CM | POA: Diagnosis not present

## 2018-04-21 DIAGNOSIS — F4323 Adjustment disorder with mixed anxiety and depressed mood: Secondary | ICD-10-CM | POA: Diagnosis not present

## 2018-04-28 DIAGNOSIS — F4323 Adjustment disorder with mixed anxiety and depressed mood: Secondary | ICD-10-CM | POA: Diagnosis not present

## 2018-05-05 DIAGNOSIS — F4323 Adjustment disorder with mixed anxiety and depressed mood: Secondary | ICD-10-CM | POA: Diagnosis not present

## 2018-05-06 DIAGNOSIS — F4323 Adjustment disorder with mixed anxiety and depressed mood: Secondary | ICD-10-CM | POA: Diagnosis not present

## 2018-05-12 DIAGNOSIS — F4323 Adjustment disorder with mixed anxiety and depressed mood: Secondary | ICD-10-CM | POA: Diagnosis not present

## 2018-05-19 DIAGNOSIS — F4323 Adjustment disorder with mixed anxiety and depressed mood: Secondary | ICD-10-CM | POA: Diagnosis not present

## 2018-05-26 DIAGNOSIS — F4323 Adjustment disorder with mixed anxiety and depressed mood: Secondary | ICD-10-CM | POA: Diagnosis not present

## 2018-06-09 DIAGNOSIS — F4323 Adjustment disorder with mixed anxiety and depressed mood: Secondary | ICD-10-CM | POA: Diagnosis not present

## 2018-06-16 DIAGNOSIS — Z125 Encounter for screening for malignant neoplasm of prostate: Secondary | ICD-10-CM | POA: Diagnosis not present

## 2018-06-16 DIAGNOSIS — E1169 Type 2 diabetes mellitus with other specified complication: Secondary | ICD-10-CM | POA: Diagnosis not present

## 2018-06-16 DIAGNOSIS — Z0000 Encounter for general adult medical examination without abnormal findings: Secondary | ICD-10-CM | POA: Diagnosis not present

## 2018-06-16 DIAGNOSIS — E291 Testicular hypofunction: Secondary | ICD-10-CM | POA: Diagnosis not present

## 2018-06-16 DIAGNOSIS — B356 Tinea cruris: Secondary | ICD-10-CM | POA: Diagnosis not present

## 2018-06-16 DIAGNOSIS — M25552 Pain in left hip: Secondary | ICD-10-CM | POA: Diagnosis not present

## 2018-06-16 DIAGNOSIS — E782 Mixed hyperlipidemia: Secondary | ICD-10-CM | POA: Diagnosis not present

## 2018-06-16 DIAGNOSIS — I10 Essential (primary) hypertension: Secondary | ICD-10-CM | POA: Diagnosis not present

## 2018-06-17 DIAGNOSIS — G4733 Obstructive sleep apnea (adult) (pediatric): Secondary | ICD-10-CM | POA: Diagnosis not present

## 2018-06-23 DIAGNOSIS — F4323 Adjustment disorder with mixed anxiety and depressed mood: Secondary | ICD-10-CM | POA: Diagnosis not present

## 2018-06-26 DIAGNOSIS — M25561 Pain in right knee: Secondary | ICD-10-CM | POA: Diagnosis not present

## 2018-06-26 DIAGNOSIS — M1712 Unilateral primary osteoarthritis, left knee: Secondary | ICD-10-CM | POA: Diagnosis not present

## 2018-06-30 DIAGNOSIS — F4323 Adjustment disorder with mixed anxiety and depressed mood: Secondary | ICD-10-CM | POA: Diagnosis not present

## 2018-07-02 DIAGNOSIS — S76312D Strain of muscle, fascia and tendon of the posterior muscle group at thigh level, left thigh, subsequent encounter: Secondary | ICD-10-CM | POA: Diagnosis not present

## 2018-07-02 DIAGNOSIS — S76311D Strain of muscle, fascia and tendon of the posterior muscle group at thigh level, right thigh, subsequent encounter: Secondary | ICD-10-CM | POA: Diagnosis not present

## 2018-07-10 DIAGNOSIS — S76312D Strain of muscle, fascia and tendon of the posterior muscle group at thigh level, left thigh, subsequent encounter: Secondary | ICD-10-CM | POA: Diagnosis not present

## 2018-07-10 DIAGNOSIS — S76311D Strain of muscle, fascia and tendon of the posterior muscle group at thigh level, right thigh, subsequent encounter: Secondary | ICD-10-CM | POA: Diagnosis not present

## 2018-07-14 DIAGNOSIS — S76312D Strain of muscle, fascia and tendon of the posterior muscle group at thigh level, left thigh, subsequent encounter: Secondary | ICD-10-CM | POA: Diagnosis not present

## 2018-07-14 DIAGNOSIS — F4323 Adjustment disorder with mixed anxiety and depressed mood: Secondary | ICD-10-CM | POA: Diagnosis not present

## 2018-07-14 DIAGNOSIS — S76311D Strain of muscle, fascia and tendon of the posterior muscle group at thigh level, right thigh, subsequent encounter: Secondary | ICD-10-CM | POA: Diagnosis not present

## 2018-07-21 DIAGNOSIS — F4323 Adjustment disorder with mixed anxiety and depressed mood: Secondary | ICD-10-CM | POA: Diagnosis not present

## 2018-07-28 DIAGNOSIS — F4323 Adjustment disorder with mixed anxiety and depressed mood: Secondary | ICD-10-CM | POA: Diagnosis not present

## 2018-08-11 DIAGNOSIS — F4323 Adjustment disorder with mixed anxiety and depressed mood: Secondary | ICD-10-CM | POA: Diagnosis not present

## 2018-08-18 DIAGNOSIS — F4323 Adjustment disorder with mixed anxiety and depressed mood: Secondary | ICD-10-CM | POA: Diagnosis not present

## 2018-08-25 DIAGNOSIS — F4323 Adjustment disorder with mixed anxiety and depressed mood: Secondary | ICD-10-CM | POA: Diagnosis not present

## 2018-09-01 DIAGNOSIS — F4323 Adjustment disorder with mixed anxiety and depressed mood: Secondary | ICD-10-CM | POA: Diagnosis not present

## 2018-09-08 DIAGNOSIS — F4323 Adjustment disorder with mixed anxiety and depressed mood: Secondary | ICD-10-CM | POA: Diagnosis not present

## 2018-09-22 DIAGNOSIS — F4323 Adjustment disorder with mixed anxiety and depressed mood: Secondary | ICD-10-CM | POA: Diagnosis not present

## 2018-10-02 DIAGNOSIS — H40053 Ocular hypertension, bilateral: Secondary | ICD-10-CM | POA: Diagnosis not present

## 2018-10-03 DIAGNOSIS — H40033 Anatomical narrow angle, bilateral: Secondary | ICD-10-CM | POA: Diagnosis not present

## 2018-11-17 DIAGNOSIS — E1169 Type 2 diabetes mellitus with other specified complication: Secondary | ICD-10-CM | POA: Diagnosis not present

## 2018-11-17 DIAGNOSIS — E291 Testicular hypofunction: Secondary | ICD-10-CM | POA: Diagnosis not present

## 2018-11-17 DIAGNOSIS — I10 Essential (primary) hypertension: Secondary | ICD-10-CM | POA: Diagnosis not present

## 2018-11-17 DIAGNOSIS — E782 Mixed hyperlipidemia: Secondary | ICD-10-CM | POA: Diagnosis not present

## 2018-11-24 DIAGNOSIS — E782 Mixed hyperlipidemia: Secondary | ICD-10-CM | POA: Diagnosis not present

## 2018-11-24 DIAGNOSIS — E291 Testicular hypofunction: Secondary | ICD-10-CM | POA: Diagnosis not present

## 2018-11-24 DIAGNOSIS — E1169 Type 2 diabetes mellitus with other specified complication: Secondary | ICD-10-CM | POA: Diagnosis not present

## 2018-12-18 DIAGNOSIS — M25775 Osteophyte, left foot: Secondary | ICD-10-CM | POA: Diagnosis not present

## 2018-12-18 DIAGNOSIS — L600 Ingrowing nail: Secondary | ICD-10-CM | POA: Diagnosis not present

## 2018-12-18 DIAGNOSIS — L03032 Cellulitis of left toe: Secondary | ICD-10-CM | POA: Diagnosis not present

## 2018-12-19 DIAGNOSIS — H40033 Anatomical narrow angle, bilateral: Secondary | ICD-10-CM | POA: Diagnosis not present

## 2018-12-25 DIAGNOSIS — H40033 Anatomical narrow angle, bilateral: Secondary | ICD-10-CM | POA: Diagnosis not present

## 2018-12-26 DIAGNOSIS — L600 Ingrowing nail: Secondary | ICD-10-CM | POA: Diagnosis not present

## 2019-01-01 DIAGNOSIS — H401131 Primary open-angle glaucoma, bilateral, mild stage: Secondary | ICD-10-CM | POA: Diagnosis not present

## 2019-01-01 DIAGNOSIS — H40053 Ocular hypertension, bilateral: Secondary | ICD-10-CM | POA: Diagnosis not present

## 2019-01-13 DIAGNOSIS — G4733 Obstructive sleep apnea (adult) (pediatric): Secondary | ICD-10-CM | POA: Diagnosis not present

## 2019-01-15 DIAGNOSIS — M25562 Pain in left knee: Secondary | ICD-10-CM | POA: Diagnosis not present

## 2019-01-15 DIAGNOSIS — M25561 Pain in right knee: Secondary | ICD-10-CM | POA: Diagnosis not present

## 2019-01-15 DIAGNOSIS — M1612 Unilateral primary osteoarthritis, left hip: Secondary | ICD-10-CM | POA: Diagnosis not present

## 2019-02-03 DIAGNOSIS — M1612 Unilateral primary osteoarthritis, left hip: Secondary | ICD-10-CM | POA: Diagnosis not present

## 2019-04-24 DIAGNOSIS — M1612 Unilateral primary osteoarthritis, left hip: Secondary | ICD-10-CM | POA: Diagnosis not present

## 2019-05-18 DIAGNOSIS — Z01812 Encounter for preprocedural laboratory examination: Secondary | ICD-10-CM | POA: Diagnosis not present

## 2019-05-19 DIAGNOSIS — H401111 Primary open-angle glaucoma, right eye, mild stage: Secondary | ICD-10-CM | POA: Diagnosis not present

## 2019-05-21 DIAGNOSIS — M1612 Unilateral primary osteoarthritis, left hip: Secondary | ICD-10-CM | POA: Diagnosis not present

## 2019-05-29 DIAGNOSIS — M1612 Unilateral primary osteoarthritis, left hip: Secondary | ICD-10-CM | POA: Diagnosis not present

## 2019-06-09 DIAGNOSIS — Z96642 Presence of left artificial hip joint: Secondary | ICD-10-CM | POA: Diagnosis not present

## 2019-06-09 DIAGNOSIS — Z9889 Other specified postprocedural states: Secondary | ICD-10-CM | POA: Diagnosis not present

## 2019-06-22 DIAGNOSIS — Z0000 Encounter for general adult medical examination without abnormal findings: Secondary | ICD-10-CM | POA: Diagnosis not present

## 2019-06-22 DIAGNOSIS — E291 Testicular hypofunction: Secondary | ICD-10-CM | POA: Diagnosis not present

## 2019-06-22 DIAGNOSIS — Z125 Encounter for screening for malignant neoplasm of prostate: Secondary | ICD-10-CM | POA: Diagnosis not present

## 2019-06-22 DIAGNOSIS — E782 Mixed hyperlipidemia: Secondary | ICD-10-CM | POA: Diagnosis not present

## 2019-06-22 DIAGNOSIS — E1169 Type 2 diabetes mellitus with other specified complication: Secondary | ICD-10-CM | POA: Diagnosis not present

## 2019-06-22 DIAGNOSIS — I10 Essential (primary) hypertension: Secondary | ICD-10-CM | POA: Diagnosis not present

## 2019-07-15 DIAGNOSIS — G4733 Obstructive sleep apnea (adult) (pediatric): Secondary | ICD-10-CM | POA: Diagnosis not present

## 2019-07-21 DIAGNOSIS — E119 Type 2 diabetes mellitus without complications: Secondary | ICD-10-CM | POA: Diagnosis not present

## 2019-12-21 DIAGNOSIS — I10 Essential (primary) hypertension: Secondary | ICD-10-CM | POA: Diagnosis not present

## 2019-12-21 DIAGNOSIS — E291 Testicular hypofunction: Secondary | ICD-10-CM | POA: Diagnosis not present

## 2019-12-21 DIAGNOSIS — E782 Mixed hyperlipidemia: Secondary | ICD-10-CM | POA: Diagnosis not present

## 2019-12-21 DIAGNOSIS — E1169 Type 2 diabetes mellitus with other specified complication: Secondary | ICD-10-CM | POA: Diagnosis not present

## 2020-04-30 ENCOUNTER — Ambulatory Visit: Payer: BLUE CROSS/BLUE SHIELD | Attending: Internal Medicine

## 2020-04-30 DIAGNOSIS — Z23 Encounter for immunization: Secondary | ICD-10-CM

## 2020-04-30 NOTE — Progress Notes (Signed)
   Covid-19 Vaccination Clinic  Name:  James Bullock    MRN: [id number] DOB: [birth year]  04/30/2020  James Bullock was observed post Covid-19 immunization for 15 minutes without incident. He was provided with Vaccine Information Sheet and instruction to access the V-Safe system.   James Bullock was instructed to call 911 with any severe reactions post vaccine: Marland Kitchen Difficulty breathing  . Swelling of face and throat  . A fast heartbeat  . A bad rash all over body  . Dizziness and weakness

## 2020-08-09 ENCOUNTER — Encounter (INDEPENDENT_AMBULATORY_CARE_PROVIDER_SITE_OTHER): Payer: Self-pay | Admitting: Otolaryngology

## 2020-08-09 ENCOUNTER — Other Ambulatory Visit: Payer: Self-pay

## 2020-08-09 ENCOUNTER — Ambulatory Visit (INDEPENDENT_AMBULATORY_CARE_PROVIDER_SITE_OTHER): Payer: BLUE CROSS/BLUE SHIELD | Admitting: Otolaryngology

## 2020-08-09 VITALS — Temp 97.3°F

## 2020-08-09 DIAGNOSIS — L723 Sebaceous cyst: Secondary | ICD-10-CM | POA: Diagnosis not present

## 2020-08-09 NOTE — Progress Notes (Signed)
HPI: James Bullock is a 65 y.o. male who presents is referred by [facility] dermatology for evaluation of right neck cyst.  Patient has a large cyst just anterior and inferior to the right earlobe.  He had this previously I&D at dermatology center.  But the cyst came right back within several weeks.  They informed him that the cyst came back he would have to have it removed and presents here.  It is not causing any pain to them presently.  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 Is otherwise relatively healthy with type 2 diabetes.  He is on no blood thinners.  Past Medical History:  Diagnosis Date  . Arthritis    knees  . Diabetes mellitus without complication (HCC)   . Diverticulosis   . History of colon polyps    benign  . Hyperlipemia    takes Crestor daily  . Hypertension    takes Losartan daily  . Joint pain   . Sleep apnea    uses CPAP nightly   Past Surgical History:  Procedure Laterality Date  . CHONDROPLASTY Left 08/13/2014   Procedure: CHONDROPLASTY;  Surgeon: Nestor Lewandowsky, MD;  Location: [facility];  Service: Orthopedics;  Laterality: Left;  . COLONOSCOPY     x 3  . KNEE ARTHROSCOPY WITH MEDIAL MENISECTOMY Left 08/13/2014   Procedure: LEFT ARTHROSCOPY KNEE PARTIAL MEDIAL MENISECTOMY CHONDDROPLASTY;  Surgeon: Nestor Lewandowsky, MD;  Location: [facility];  Service: Orthopedics;  Laterality: Left;  . NERVE AND ARTERY REPAIR Right 01/08/2012   index finger  . SHOULDER ARTHROSCOPY Right 1983  . TOTAL KNEE ARTHROPLASTY Left 05/18/2015   Procedure: TOTAL KNEE ARTHROPLASTY;  Surgeon: Gean Birchwood, MD;  Location: [facility];  Service: Orthopedics;  Laterality: Left;   Social History   Socioeconomic History  . Marital status: Married    Spouse name: Not on file  . Number of children: Not on file  . Years of education: Not on file  . Highest education level: Not on  file  Occupational History  . Not on file  Tobacco Use  . Smoking status: Never Smoker  . Smokeless tobacco: Never Used  Substance and Sexual Activity  . Alcohol use: Yes    Comment: rarely  . Drug use: No  . Sexual activity: Yes  Other Topics Concern  . Not on file  Social History Narrative  . Not on file   Social Determinants of Health   Financial Resource Strain: Not on file  Food Insecurity: Not on file  Transportation Needs: Not on file  Physical Activity: Not on file  Stress: Not on file  Social Connections: Not on file   Family History  Problem Relation Age of Onset  . Hypertension Mother   . Hypertension Father   . Diabetes type II Father    Allergies  Allergen Reactions  . Lipitor [Atorvastatin] Other (See Comments)    Muscle Aches   Prior to Admission medications   Medication Sig Start Date End Date Taking? Authorizing Provider  acetaminophen (TYLENOL) 650 MG CR tablet Take 1,300 mg by mouth every 8 (eight) hours as needed for pain.    [provider]  aspirin EC 325 MG tablet Take 1 tablet (325 mg total) by mouth 2 (two) times daily. 05/18/15   Allena Katz, PA-C  CINNAMON PO Take 2 capsules by  mouth daily.     [provider]  Flaxseed, Linseed, (FLAXSEED OIL PO) Take 1,400 mg by mouth daily.     [provider]  losartan (COZAAR) 100 MG tablet Take 100 mg by mouth daily.    [provider]  meloxicam (MOBIC) 15 MG tablet Take 15 mg by mouth daily.    [provider]  methocarbamol (ROBAXIN) 500 MG tablet Take 1 tablet (500 mg total) by mouth 2 (two) times daily with a meal. Patient not taking: Reported on 11/29/2015 05/18/15   Allena Katz, PA-C  Multiple Vitamin (MULTIVITAMIN WITH MINERALS) TABS Take 1 tablet by mouth daily.    [provider]  Omega-3 Fatty Acids (FISH OIL) 1200 MG CAPS Take 1 capsule by mouth daily.    [provider]  oxyCODONE-acetaminophen (ROXICET) 5-325 MG tablet  Take 1 tablet by mouth every 4 (four) hours as needed. Patient not taking: Reported on 11/29/2015 05/18/15   Allena Katz, PA-C  rosuvastatin (CRESTOR) 10 MG tablet Take 10 mg by mouth daily. Reported on 11/29/2015    [provider]  Testosterone (ANDROGEL) 20.25 MG/1.25GM (1.62%) GEL Place 1 Squirt onto the skin daily.    [provider]  vitamin C (ASCORBIC ACID) 500 MG tablet Take 500 mg by mouth daily.    [provider]     Positive ROS: Otherwise negative  All other systems have been reviewed and were otherwise negative with the exception of those mentioned in the HPI and as above.  Physical Exam: Constitutional: Alert, well-appearing, no acute distress Ears: External ears without lesions or tenderness. Ear canals are clear bilaterally with intact, clear TMs.  Nasal: External nose without lesions. Septum with mild deformity.. Clear nasal passages Oral: Lips and gums without lesions. Tongue and palate mucosa without lesions. Posterior oropharynx clear. Neck: No palpable adenopathy or masses.  Patient has an approximate 2 to 3 cm cyst with some overlying mild erythema.  Is not very tender and is freely mobile in the subcutaneous tissue.  He has normal facial nerve function. Respiratory: Breathing comfortably  Skin: No facial/neck lesions or rash noted.  Procedures  Assessment: Findings are consistent with a sebaceous cyst or dermoid cyst.  He has had previous I&D with recurrence of the cyst.  Plan: He will be scheduled for local excision of the cyst under local anesthesia next week.   Narda Bonds, MD   CC:

## 2020-08-15 ENCOUNTER — Encounter (HOSPITAL_BASED_OUTPATIENT_CLINIC_OR_DEPARTMENT_OTHER): Payer: Self-pay

## 2020-08-15 ENCOUNTER — Ambulatory Visit (HOSPITAL_BASED_OUTPATIENT_CLINIC_OR_DEPARTMENT_OTHER): Admit: 2020-08-15 | Payer: BLUE CROSS/BLUE SHIELD | Admitting: Otolaryngology

## 2020-08-15 DIAGNOSIS — L723 Sebaceous cyst: Secondary | ICD-10-CM | POA: Diagnosis not present

## 2020-08-15 SURGERY — EXCISION MASS
Anesthesia: LOCAL | Laterality: Right

## 2020-08-22 ENCOUNTER — Ambulatory Visit (INDEPENDENT_AMBULATORY_CARE_PROVIDER_SITE_OTHER): Payer: BLUE CROSS/BLUE SHIELD | Admitting: Otolaryngology

## 2020-08-22 ENCOUNTER — Other Ambulatory Visit: Payer: Self-pay

## 2020-08-22 VITALS — Temp 97.2°F

## 2020-08-22 DIAGNOSIS — Z4889 Encounter for other specified surgical aftercare: Secondary | ICD-10-CM

## 2020-08-22 NOTE — Progress Notes (Signed)
HPI: James Bullock is a 65 y.o. male who presents 7 days s/p excision of right cheek nodule.  He has done well.  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   Past Medical History:  Diagnosis Date  . Arthritis    knees  . Diabetes mellitus without complication (HCC)   . Diverticulosis   . History of colon polyps    benign  . Hyperlipemia    takes Crestor daily  . Hypertension    takes Losartan daily  . Joint pain   . Sleep apnea    uses CPAP nightly   Past Surgical History:  Procedure Laterality Date  . CHONDROPLASTY Left 08/13/2014   Procedure: CHONDROPLASTY;  Surgeon: Nestor Lewandowsky, MD;  Location: [facility];  Service: Orthopedics;  Laterality: Left;  . COLONOSCOPY     x 3  . KNEE ARTHROSCOPY WITH MEDIAL MENISECTOMY Left 08/13/2014   Procedure: LEFT ARTHROSCOPY KNEE PARTIAL MEDIAL MENISECTOMY CHONDDROPLASTY;  Surgeon: Nestor Lewandowsky, MD;  Location: [facility];  Service: Orthopedics;  Laterality: Left;  . NERVE AND ARTERY REPAIR Right 01/08/2012   index finger  . SHOULDER ARTHROSCOPY Right 1983  . TOTAL KNEE ARTHROPLASTY Left 05/18/2015   Procedure: TOTAL KNEE ARTHROPLASTY;  Surgeon: Gean Birchwood, MD;  Location: [facility];  Service: Orthopedics;  Laterality: Left;   Social History   Socioeconomic History  . Marital status: Married    Spouse name: Not on file  . Number of children: Not on file  . Years of education: Not on file  . Highest education level: Not on file  Occupational History  . Not on file  Tobacco Use  . Smoking status: Never Smoker  . Smokeless tobacco: Never Used  Substance and Sexual Activity  . Alcohol use: Yes    Comment: rarely  . Drug use: No  . Sexual activity: Yes  Other Topics Concern  . Not on file  Social History Narrative  . Not on file   Social Determinants of Health   Financial Resource Strain: Not on file  Food Insecurity: Not on file  Transportation  Needs: Not on file  Physical Activity: Not on file  Stress: Not on file  Social Connections: Not on file   Family History  Problem Relation Age of Onset  . Hypertension Mother   . Hypertension Father   . Diabetes type II Father    Allergies  Allergen Reactions  . Lipitor [Atorvastatin] Other (See Comments)    Muscle Aches   Prior to Admission medications   Medication Sig Start Date End Date Taking? Authorizing Provider  acetaminophen (TYLENOL) 650 MG CR tablet Take 1,300 mg by mouth every 8 (eight) hours as needed for pain.    [provider]  aspirin EC 325 MG tablet Take 1 tablet (325 mg total) by mouth 2 (two) times daily. 05/18/15   Allena Katz, PA-C  CINNAMON PO Take 2 capsules by mouth daily.     [provider]  Flaxseed, Linseed, (FLAXSEED OIL PO) Take 1,400 mg by mouth daily.     [provider]  losartan (COZAAR) 100 MG tablet Take 100 mg by mouth daily.    [provider]  meloxicam (MOBIC) 15 MG tablet Take 15 mg by mouth daily.    [provider]  methocarbamol (ROBAXIN) 500 MG tablet Take 1 tablet (500 mg total) by mouth 2 (two) times daily with a meal. Patient not taking:  Reported on 11/29/2015 05/18/15   Allena Katz, PA-C  Multiple Vitamin (MULTIVITAMIN WITH MINERALS) TABS Take 1 tablet by mouth daily.    [provider]  Omega-3 Fatty Acids (FISH OIL) 1200 MG CAPS Take 1 capsule by mouth daily.    [provider]  oxyCODONE-acetaminophen (ROXICET) 5-325 MG tablet Take 1 tablet by mouth every 4 (four) hours as needed. Patient not taking: Reported on 11/29/2015 05/18/15   Allena Katz, PA-C  rosuvastatin (CRESTOR) 10 MG tablet Take 10 mg by mouth daily. Reported on 11/29/2015    [provider]  Testosterone (ANDROGEL) 20.25 MG/1.25GM (1.62%) GEL Place 1 Squirt onto the skin daily.    [provider]  vitamin C (ASCORBIC ACID) 500 MG tablet Take 500 mg by mouth daily.    [provider]     Physical Exam: Excision site is healing nicely with minimal scar.   Assessment: S/p excision of right cheek lesion.  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  Plan: No further therapy is needed.  He can apply Mederma to the scar tissue if desired.  He will follow-up as needed.   Narda Bonds, MD

## 2021-01-02 DIAGNOSIS — I10 Essential (primary) hypertension: Secondary | ICD-10-CM | POA: Diagnosis not present

## 2021-01-02 DIAGNOSIS — E1169 Type 2 diabetes mellitus with other specified complication: Secondary | ICD-10-CM | POA: Diagnosis not present

## 2021-01-02 DIAGNOSIS — E782 Mixed hyperlipidemia: Secondary | ICD-10-CM | POA: Diagnosis not present

## 2021-01-02 DIAGNOSIS — E291 Testicular hypofunction: Secondary | ICD-10-CM | POA: Diagnosis not present

## 2021-01-19 DIAGNOSIS — H401111 Primary open-angle glaucoma, right eye, mild stage: Secondary | ICD-10-CM | POA: Diagnosis not present

## 2021-02-15 DIAGNOSIS — G4733 Obstructive sleep apnea (adult) (pediatric): Secondary | ICD-10-CM | POA: Diagnosis not present

## 2021-02-16 DIAGNOSIS — Z0000 Encounter for general adult medical examination without abnormal findings: Secondary | ICD-10-CM | POA: Diagnosis not present

## 2021-02-16 DIAGNOSIS — I10 Essential (primary) hypertension: Secondary | ICD-10-CM | POA: Diagnosis not present

## 2021-02-16 DIAGNOSIS — E291 Testicular hypofunction: Secondary | ICD-10-CM | POA: Diagnosis not present

## 2021-02-16 DIAGNOSIS — E782 Mixed hyperlipidemia: Secondary | ICD-10-CM | POA: Diagnosis not present

## 2021-02-16 DIAGNOSIS — E1169 Type 2 diabetes mellitus with other specified complication: Secondary | ICD-10-CM | POA: Diagnosis not present

## 2021-04-20 DIAGNOSIS — E291 Testicular hypofunction: Secondary | ICD-10-CM | POA: Diagnosis not present

## 2021-04-20 DIAGNOSIS — Z23 Encounter for immunization: Secondary | ICD-10-CM | POA: Diagnosis not present

## 2021-04-20 DIAGNOSIS — I10 Essential (primary) hypertension: Secondary | ICD-10-CM | POA: Diagnosis not present

## 2021-04-20 DIAGNOSIS — E1169 Type 2 diabetes mellitus with other specified complication: Secondary | ICD-10-CM | POA: Diagnosis not present

## 2021-04-20 DIAGNOSIS — E782 Mixed hyperlipidemia: Secondary | ICD-10-CM | POA: Diagnosis not present

## 2021-05-31 DIAGNOSIS — G4733 Obstructive sleep apnea (adult) (pediatric): Secondary | ICD-10-CM | POA: Diagnosis not present

## 2021-06-30 DIAGNOSIS — G4733 Obstructive sleep apnea (adult) (pediatric): Secondary | ICD-10-CM | POA: Diagnosis not present

## 2021-07-18 DIAGNOSIS — H5203 Hypermetropia, bilateral: Secondary | ICD-10-CM | POA: Diagnosis not present

## 2021-07-31 DIAGNOSIS — G4733 Obstructive sleep apnea (adult) (pediatric): Secondary | ICD-10-CM | POA: Diagnosis not present

## 2021-08-24 DIAGNOSIS — I10 Essential (primary) hypertension: Secondary | ICD-10-CM | POA: Diagnosis not present

## 2021-08-24 DIAGNOSIS — E782 Mixed hyperlipidemia: Secondary | ICD-10-CM | POA: Diagnosis not present

## 2021-08-24 DIAGNOSIS — E1169 Type 2 diabetes mellitus with other specified complication: Secondary | ICD-10-CM | POA: Diagnosis not present

## 2021-08-24 DIAGNOSIS — E291 Testicular hypofunction: Secondary | ICD-10-CM | POA: Diagnosis not present

## 2021-09-06 DIAGNOSIS — M25511 Pain in right shoulder: Secondary | ICD-10-CM | POA: Diagnosis not present

## 2021-09-06 DIAGNOSIS — M542 Cervicalgia: Secondary | ICD-10-CM | POA: Diagnosis not present

## 2021-09-06 DIAGNOSIS — M4722 Other spondylosis with radiculopathy, cervical region: Secondary | ICD-10-CM | POA: Diagnosis not present

## 2021-09-11 DIAGNOSIS — M542 Cervicalgia: Secondary | ICD-10-CM | POA: Diagnosis not present

## 2021-09-15 DIAGNOSIS — M4722 Other spondylosis with radiculopathy, cervical region: Secondary | ICD-10-CM | POA: Diagnosis not present

## 2021-10-02 DIAGNOSIS — M5412 Radiculopathy, cervical region: Secondary | ICD-10-CM | POA: Diagnosis not present

## 2021-10-09 ENCOUNTER — Other Ambulatory Visit: Payer: Self-pay | Admitting: Orthopedic Surgery

## 2021-10-17 DIAGNOSIS — H5203 Hypermetropia, bilateral: Secondary | ICD-10-CM | POA: Diagnosis not present

## 2021-10-17 DIAGNOSIS — H401131 Primary open-angle glaucoma, bilateral, mild stage: Secondary | ICD-10-CM | POA: Diagnosis not present

## 2021-10-17 DIAGNOSIS — H401121 Primary open-angle glaucoma, left eye, mild stage: Secondary | ICD-10-CM | POA: Diagnosis not present

## 2021-10-17 DIAGNOSIS — H401111 Primary open-angle glaucoma, right eye, mild stage: Secondary | ICD-10-CM | POA: Diagnosis not present

## 2021-10-24 NOTE — Pre-Procedure Instructions (Signed)
Surgical Instructions ? ? ? Your procedure is scheduled on Thursday 11/02/21. ? ? Report to [facility] Main Entrance "A" at 08:45 A.M., then check in with the Admitting office. ? Call this number if you have problems the morning of surgery: ? [phone] ? ? If you have any questions prior to your surgery date call [phone]: Open Monday-Friday 8am-4pm ? ? ? Remember: ? Do not eat after midnight the night before your surgery ? ?You may drink clear liquids until 08:45 A.M. the morning of your surgery.   ?Clear liquids allowed are: Water, Non-Citrus Juices (without pulp), Carbonated Beverages, Clear Tea, Black Coffee ONLY (NO MILK, CREAM OR POWDERED CREAMER of any kind), and Gatorade ? ?Patient Instructions ? ?The night before surgery:  ?No food after midnight. ONLY clear liquids after midnight ? ? ?The day of surgery (if you have diabetes): ?Drink ONE (1) 12 oz G2 given to you in your pre admission testing appointment by 08:45 A.M. the morning of surgery. Drink in one sitting. Do not sip.  ?This drink was given to you during your hospital  ?pre-op appointment visit.  ?Nothing else to drink after completing the  ?12 oz bottle of G2. ? ?       If you have questions, please contact your surgeon?s office. ? ?  ? Take these medicines the morning of surgery with A SIP OF WATER:  ? rosuvastatin (CRESTOR) ? timolol (TIMOPTIC)  ? ? Take these medicines if needed:  ? acetaminophen (TYLENOL) ? ?As of today, STOP taking any Aspirin (unless otherwise instructed by your surgeon) Aleve, Naproxen, Ibuprofen, Motrin, Advil, Goody's, BC's, all herbal medications, fish oil, and all vitamins. ? ?        WHAT DO I DO ABOUT MY DIABETES MEDICATION? ? ? ?Do not take oral diabetes medicines (pills) the morning of surgery. ? ?DO NOT TAKE metFORMIN (GLUCOPHAGE) the morning of surgery.  ?     ? ?THE MORNING OF SURGERY, take units of  15 units of Insulin Glargine Basaglar. This is half of your normal dose.  ? ?The day of surgery, do not take  other diabetes injectables, including Byetta (exenatide), Bydureon (exenatide ER), Victoza (liraglutide), or Trulicity (dulaglutide). ? ?HOW TO MANAGE YOUR DIABETES ?BEFORE AND AFTER SURGERY ? ?Why is it important to control my blood sugar before and after surgery? ?Improving blood sugar levels before and after surgery helps healing and can limit problems. ?A way of improving blood sugar control is eating a healthy diet by: ? Eating less sugar and carbohydrates ? Increasing activity/exercise ? Talking with your doctor about reaching your blood sugar goals ?High blood sugars (greater than 180 mg/dL) can raise your risk of infections and slow your recovery, so you will need to focus on controlling your diabetes during the weeks before surgery. ?Make sure that the doctor who takes care of your diabetes knows about your planned surgery including the date and location. ? ?How do I manage my blood sugar before surgery? ?Check your blood sugar at least 4 times a day, starting 2 days before surgery, to make sure that the level is not too high or low. ? ?Check your blood sugar the morning of your surgery when you wake up and every 2 hours until you get to the Short Stay unit. ? ?If your blood sugar is less than 70 mg/dL, you will need to treat for low blood sugar: ?Do not take insulin. ?Treat a low blood sugar (less than 70 mg/dL) with ? cup  of clear juice (cranberry or apple), 4 glucose tablets, OR glucose gel. ?Recheck blood sugar in 15 minutes after treatment (to make sure it is greater than 70 mg/dL). If your blood sugar is not greater than 70 mg/dL on recheck, call [phone] for further instructions. ?Report your blood sugar to the short stay nurse when you get to Short Stay. ? ?If you are admitted to the hospital after surgery: ?Your blood sugar will be checked by the staff and you will probably be given insulin after surgery (instead of oral diabetes medicines) to make sure you have good blood sugar levels. ?The  goal for blood sugar control after surgery is 80-180 mg/dL.  ?Do not wear jewelry or makeup ?Do not wear lotions, powders, perfumes/colognes, or deodorant. ?Do not shave 48 hours prior to surgery.  Men may shave face and neck. ?Do not bring valuables to the hospital. ?Do not wear nail polish, gel polish, artificial nails, or any other type of covering on natural nails (fingers and toes) ?If you have artificial nails or gel coating that need to be removed by a nail salon, please have this removed prior to surgery. Artificial nails or gel coating may interfere with anesthesia's ability to adequately monitor your vital signs. ? ?[facility] is not responsible for any belongings or valuables. .  ? ?Do NOT Smoke (Tobacco/Vaping)  24 hours prior to your procedure ? ?If you use a CPAP at night, you may bring your mask for your overnight stay. ?  ?Contacts, glasses, hearing aids, dentures or partials may not be worn into surgery, please bring cases for these belongings ?  ?For patients admitted to the hospital, discharge time will be determined by your treatment team. ?  ?Patients discharged the day of surgery will not be allowed to drive home, and someone needs to stay with them for 24 hours. ? ? ?SURGICAL WAITING ROOM VISITATION ?Patients having surgery or a procedure in a hospital may have two support people. ?Children under the age of 44 must have an adult with them who is not the patient. ?They may stay in the waiting area during the procedure and may switch out with other visitors. If the patient needs to stay at the hospital during part of their recovery, the visitor guidelines for inpatient rooms apply. ? ?Please refer to the [facility] website for the visitor guidelines for Inpatients (after your surgery is over and you are in a regular room).  ? ? ? ? ? ?Special instructions:   ? ?Oral Hygiene is also important to reduce your risk of infection.  Remember - BRUSH YOUR TEETH THE MORNING OF SURGERY WITH YOUR REGULAR  TOOTHPASTE ? ? ?[facility]- Preparing For Surgery ? ?Before surgery, you can play an important role. Because skin is not sterile, your skin needs to be as free of germs as possible. You can reduce the number of germs on your skin by washing with CHG (chlorahexidine gluconate) Soap before surgery.  CHG is an antiseptic cleaner which kills germs and bonds with the skin to continue killing germs even after washing.   ? ? ?Please do not use if you have an allergy to CHG or antibacterial soaps. If your skin becomes reddened/irritated stop using the CHG.  ?Do not shave (including legs and underarms) for at least 48 hours prior to first CHG shower. It is OK to shave your face. ? ?Please follow these instructions carefully. ?  ? ? Shower the NIGHT BEFORE SURGERY and the MORNING OF SURGERY with  CHG Soap.  ? If you chose to wash your hair, wash your hair first as usual with your normal shampoo. After you shampoo, rinse your hair and body thoroughly to remove the shampoo.  Then [employer] CorporationWash Face and genitals (private parts) with your normal soap and rinse thoroughly to remove soap. ? ?After that Use CHG Soap as you would any other liquid soap. You can apply CHG directly to the skin and wash gently with a scrungie or a clean washcloth.  ? ?Apply the CHG Soap to your body ONLY FROM THE NECK DOWN.  Do not use on open wounds or open sores. Avoid contact with your eyes, ears, mouth and genitals (private parts). Wash Face and genitals (private parts)  with your normal soap.  ? ?Wash thoroughly, paying special attention to the area where your surgery will be performed. ? ?Thoroughly rinse your body with warm water from the neck down. ? ?DO NOT shower/wash with your normal soap after using and rinsing off the CHG Soap. ? ?Pat yourself dry with a CLEAN TOWEL. ? ?Wear CLEAN PAJAMAS to bed the night before surgery ? ?Place CLEAN SHEETS on your bed the night before your surgery ? ?DO NOT SLEEP WITH PETS. ? ? ?Day of Surgery: ? ?Take a shower  with CHG soap. ?Wear Clean/Comfortable clothing the morning of surgery ?Do not apply any deodorants/lotions.   ?Remember to brush your teeth WITH YOUR REGULAR TOOTHPASTE. ? ? ? ?If you received a COVID tes

## 2021-10-25 ENCOUNTER — Encounter (HOSPITAL_COMMUNITY): Payer: Self-pay

## 2021-10-25 ENCOUNTER — Other Ambulatory Visit: Payer: Self-pay

## 2021-10-25 ENCOUNTER — Encounter (HOSPITAL_COMMUNITY)
Admission: RE | Admit: 2021-10-25 | Discharge: 2021-10-25 | Disposition: A | Discharge status: Home / Self Care | Payer: Medicare Other | Source: Ambulatory Visit | Attending: Orthopedic Surgery | Admitting: Orthopedic Surgery

## 2021-10-25 VITALS — BP 151/88 | HR 100 | Temp 97.8°F | Resp 17 | Ht 71.0 in | Wt 235.8 lb

## 2021-10-25 DIAGNOSIS — Z01818 Encounter for other preprocedural examination: Secondary | ICD-10-CM | POA: Insufficient documentation

## 2021-10-25 DIAGNOSIS — I10 Essential (primary) hypertension: Secondary | ICD-10-CM | POA: Diagnosis not present

## 2021-10-25 DIAGNOSIS — M5412 Radiculopathy, cervical region: Secondary | ICD-10-CM | POA: Diagnosis not present

## 2021-10-25 DIAGNOSIS — E119 Type 2 diabetes mellitus without complications: Secondary | ICD-10-CM | POA: Diagnosis not present

## 2021-10-25 LAB — CBC
HCT: 45.4 % (ref 39.0–52.0)
Hemoglobin: 15.7 g/dL (ref 13.0–17.0)
MCH: 32.3 pg (ref 26.0–34.0)
MCHC: 34.6 g/dL (ref 30.0–36.0)
MCV: 93.4 fL (ref 80.0–100.0)
Platelets: 212 10*3/uL (ref 150–400)
RBC: 4.86 MIL/uL (ref 4.22–5.81)
RDW: 11.6 % (ref 11.5–15.5)
WBC: 5.7 10*3/uL (ref 4.0–10.5)
nRBC: 0 % (ref 0.0–0.2)

## 2021-10-25 LAB — BASIC METABOLIC PANEL
Anion gap: 8 (ref 5–15)
BUN: 17 mg/dL (ref 8–23)
CO2: 27 mmol/L (ref 22–32)
Calcium: 9.2 mg/dL (ref 8.9–10.3)
Chloride: 103 mmol/L (ref 98–111)
Creatinine, Ser: 1.15 mg/dL (ref 0.61–1.24)
GFR, Estimated: >60 mL/min (ref >60)
Glucose, Bld: 179 mg/dL — ABNORMAL HIGH (ref 70–99)
Potassium: 4.5 mmol/L (ref 3.5–5.1)
Sodium: 138 mmol/L (ref 135–145)

## 2021-10-25 LAB — TYPE AND SCREEN
ABO/RH(D): O POS
Antibody Screen: NEGATIVE

## 2021-10-25 LAB — SURGICAL PCR SCREEN
MRSA, PCR: NEGATIVE
Staphylococcus aureus: NEGATIVE

## 2021-10-25 LAB — GLUCOSE, CAPILLARY: Glucose-Capillary: 207 mg/dL — ABNORMAL HIGH (ref 70–99)

## 2021-10-25 LAB — HEMOGLOBIN A1C
Hgb A1c MFr Bld: 6.3 % — ABNORMAL HIGH (ref 4.8–5.6)
Mean Plasma Glucose: 134.11 mg/dL

## 2021-10-25 NOTE — Progress Notes (Signed)
PCP - Dr. Tally Joe ?Cardiologist - denies ? ?PPM/ICD - denies ? ? ?Chest x-ray - 05/06/15 ?EKG - 10/25/21 in PAT ?Stress Test - denies ?ECHO - denies ?Cardiac Cath - denies ? ?Sleep Study - 10+ years ago, OSA+ ?CPAP - nightly ? ?DM- Type 2 ?Fasting Blood Sugar - 140-150 ?Checks Blood Sugar once a day ? ?ASA/Blood Thinner Instructions: n/a ? ? ?ERAS Protcol - yes ?PRE-SURGERY  G2- given at PAT ? ?COVID TEST- n/a ? ? ?Anesthesia review: no ? ?Patient denies shortness of breath, fever, cough and chest pain at PAT appointment ? ? ?All instructions explained to the patient, with a verbal understanding of the material. Patient agrees to go over the instructions while at home for a better understanding. Patient also instructed to notify surgeon of any contact with COVID+ person or if he develops any symptoms. The opportunity to ask questions was provided. ?  ?

## 2021-11-02 ENCOUNTER — Inpatient Hospital Stay (HOSPITAL_COMMUNITY): Payer: Medicare Other

## 2021-11-02 ENCOUNTER — Inpatient Hospital Stay (HOSPITAL_COMMUNITY)
Admission: RE | Disposition: A | Discharge status: Home / Self Care | Payer: Self-pay | Source: Home / Self Care | Attending: Orthopedic Surgery

## 2021-11-02 ENCOUNTER — Inpatient Hospital Stay (HOSPITAL_COMMUNITY): Payer: Medicare Other | Admitting: Certified Registered Nurse Anesthetist

## 2021-11-02 ENCOUNTER — Inpatient Hospital Stay (HOSPITAL_COMMUNITY)
Admission: RE | Admit: 2021-11-02 | Discharge: 2021-11-02 | DRG: 030 | Disposition: A | Discharge status: Home / Self Care | Payer: Medicare Other | Attending: Orthopedic Surgery | Admitting: Orthopedic Surgery

## 2021-11-02 ENCOUNTER — Encounter (HOSPITAL_COMMUNITY): Payer: Self-pay | Admitting: Orthopedic Surgery

## 2021-11-02 ENCOUNTER — Other Ambulatory Visit: Payer: Self-pay

## 2021-11-02 DIAGNOSIS — Z794 Long term (current) use of insulin: Secondary | ICD-10-CM

## 2021-11-02 DIAGNOSIS — Z7984 Long term (current) use of oral hypoglycemic drugs: Secondary | ICD-10-CM | POA: Diagnosis not present

## 2021-11-02 DIAGNOSIS — E785 Hyperlipidemia, unspecified: Secondary | ICD-10-CM | POA: Diagnosis not present

## 2021-11-02 DIAGNOSIS — M4802 Spinal stenosis, cervical region: Secondary | ICD-10-CM | POA: Diagnosis present

## 2021-11-02 DIAGNOSIS — E119 Type 2 diabetes mellitus without complications: Secondary | ICD-10-CM | POA: Diagnosis not present

## 2021-11-02 DIAGNOSIS — Z885 Allergy status to narcotic agent status: Secondary | ICD-10-CM

## 2021-11-02 DIAGNOSIS — Z8249 Family history of ischemic heart disease and other diseases of the circulatory system: Secondary | ICD-10-CM | POA: Diagnosis not present

## 2021-11-02 DIAGNOSIS — Z79899 Other long term (current) drug therapy: Secondary | ICD-10-CM

## 2021-11-02 DIAGNOSIS — G4730 Sleep apnea, unspecified: Secondary | ICD-10-CM | POA: Diagnosis not present

## 2021-11-02 DIAGNOSIS — I10 Essential (primary) hypertension: Secondary | ICD-10-CM | POA: Diagnosis not present

## 2021-11-02 DIAGNOSIS — Z888 Allergy status to other drugs, medicaments and biological substances status: Secondary | ICD-10-CM

## 2021-11-02 DIAGNOSIS — M1710 Unilateral primary osteoarthritis, unspecified knee: Principal | ICD-10-CM

## 2021-11-02 DIAGNOSIS — Z833 Family history of diabetes mellitus: Secondary | ICD-10-CM

## 2021-11-02 DIAGNOSIS — Z96642 Presence of left artificial hip joint: Secondary | ICD-10-CM | POA: Diagnosis present

## 2021-11-02 DIAGNOSIS — M5410 Radiculopathy, site unspecified: Secondary | ICD-10-CM | POA: Diagnosis present

## 2021-11-02 DIAGNOSIS — M5412 Radiculopathy, cervical region: Secondary | ICD-10-CM

## 2021-11-02 DIAGNOSIS — Z96653 Presence of artificial knee joint, bilateral: Secondary | ICD-10-CM | POA: Diagnosis not present

## 2021-11-02 DIAGNOSIS — Z9989 Dependence on other enabling machines and devices: Secondary | ICD-10-CM | POA: Diagnosis not present

## 2021-11-02 DIAGNOSIS — Z7982 Long term (current) use of aspirin: Secondary | ICD-10-CM | POA: Diagnosis not present

## 2021-11-02 DIAGNOSIS — G4733 Obstructive sleep apnea (adult) (pediatric): Secondary | ICD-10-CM | POA: Diagnosis not present

## 2021-11-02 DIAGNOSIS — Z9889 Other specified postprocedural states: Secondary | ICD-10-CM

## 2021-11-02 HISTORY — PX: ANTERIOR CERVICAL DECOMPRESSION/DISCECTOMY FUSION 4 LEVELS: SHX5556

## 2021-11-02 LAB — GLUCOSE, CAPILLARY
Glucose-Capillary: 147 mg/dL — ABNORMAL HIGH (ref 70–99)
Glucose-Capillary: 97 mg/dL (ref 70–99)
Glucose-Capillary: 110 mg/dL — ABNORMAL HIGH (ref 70–99)

## 2021-11-02 SURGERY — ANTERIOR CERVICAL DECOMPRESSION/DISCECTOMY FUSION 4 LEVELS
Anesthesia: General | Site: Spine Cervical

## 2021-11-02 MED ORDER — ROCURONIUM BROMIDE 10 MG/ML (PF) SYRINGE
PREFILLED_SYRINGE | INTRAVENOUS | Status: DC | PRN
  Administered 2021-11-02: 60 mg via INTRAVENOUS
  Administered 2021-11-02: 40 mg via INTRAVENOUS

## 2021-11-02 MED ORDER — INSULIN ASPART 100 UNIT/ML IJ SOLN
0.0000 [IU] | INTRAMUSCULAR | Status: DC | PRN
  Administered 2021-11-02: 2 [IU] via SUBCUTANEOUS

## 2021-11-02 MED ORDER — SODIUM CHLORIDE 0.9% FLUSH: 3.0000 mL | Freq: Two times a day (BID) | INTRAVENOUS | Status: DC

## 2021-11-02 MED ORDER — LIDOCAINE 2% (20 MG/ML) 5 ML SYRINGE
INTRAMUSCULAR | Status: AC
  Filled 2021-11-02: qty 5

## 2021-11-02 MED ORDER — ONDANSETRON HCL 4 MG/2ML IJ SOLN: 4.0000 mg | Freq: Once | INTRAMUSCULAR | Status: DC | PRN

## 2021-11-02 MED ORDER — MORPHINE SULFATE (PF) 2 MG/ML IV SOLN: 1.0000 mg | INTRAVENOUS | Status: DC | PRN

## 2021-11-02 MED ORDER — ALUM & MAG HYDROXIDE-SIMETH 200-200-20 MG/5ML PO SUSP: 30.0000 mL | Freq: Four times a day (QID) | ORAL | Status: DC | PRN

## 2021-11-02 MED ORDER — METHOCARBAMOL 500 MG PO TABS: 500.0000 mg | ORAL_TABLET | Freq: Two times a day (BID) | ORAL | 0 refills | Status: DC

## 2021-11-02 MED ORDER — ACETAMINOPHEN 325 MG PO TABS: 650.0000 mg | ORAL_TABLET | ORAL | Status: DC | PRN

## 2021-11-02 MED ORDER — PROPOFOL 10 MG/ML IV BOLUS
INTRAVENOUS | Status: AC
  Filled 2021-11-02: qty 20

## 2021-11-02 MED ORDER — PHENOL 1.4 % MT LIQD: 1.0000 | OROMUCOSAL | Status: DC | PRN

## 2021-11-02 MED ORDER — THROMBIN 20000 UNITS EX SOLR
CUTANEOUS | Status: DC | PRN
  Administered 2021-11-02: 20000 [IU] via TOPICAL

## 2021-11-02 MED ORDER — BUPIVACAINE-EPINEPHRINE 0.25% -1:200000 IJ SOLN
INTRAMUSCULAR | Status: DC | PRN
  Administered 2021-11-02: 10 mL

## 2021-11-02 MED ORDER — 0.9 % SODIUM CHLORIDE (POUR BTL) OPTIME
TOPICAL | Status: DC | PRN
  Administered 2021-11-02: 1000 mL

## 2021-11-02 MED ORDER — ONDANSETRON HCL 4 MG/2ML IJ SOLN: 4.0000 mg | Freq: Four times a day (QID) | INTRAMUSCULAR | Status: DC | PRN

## 2021-11-02 MED ORDER — MENTHOL 3 MG MT LOZG: 1.0000 | LOZENGE | OROMUCOSAL | Status: DC | PRN

## 2021-11-02 MED ORDER — FENTANYL CITRATE (PF) 100 MCG/2ML IJ SOLN
INTRAMUSCULAR | Status: AC
  Filled 2021-11-02: qty 2

## 2021-11-02 MED ORDER — OXYCODONE HCL 5 MG PO TABS
5.0000 mg | ORAL_TABLET | Freq: Once | ORAL | Status: AC
  Administered 2021-11-02: 5 mg via ORAL

## 2021-11-02 MED ORDER — ACETAMINOPHEN ER 650 MG PO TBCR: 1300.0000 mg | EXTENDED_RELEASE_TABLET | Freq: Three times a day (TID) | ORAL | Status: DC | PRN

## 2021-11-02 MED ORDER — FENTANYL CITRATE (PF) 250 MCG/5ML IJ SOLN
INTRAMUSCULAR | Status: DC | PRN
  Administered 2021-11-02: 150 ug via INTRAVENOUS
  Administered 2021-11-02 (×2): 50 ug via INTRAVENOUS

## 2021-11-02 MED ORDER — CEFAZOLIN SODIUM-DEXTROSE 2-4 GM/100ML-% IV SOLN: 2.0000 g | Freq: Three times a day (TID) | INTRAVENOUS | Status: DC

## 2021-11-02 MED ORDER — IRBESARTAN 300 MG PO TABS: 300.0000 mg | ORAL_TABLET | Freq: Every day | ORAL | Status: DC

## 2021-11-02 MED ORDER — SUGAMMADEX SODIUM 200 MG/2ML IV SOLN
INTRAVENOUS | Status: DC | PRN
  Administered 2021-11-02: 200 mg via INTRAVENOUS

## 2021-11-02 MED ORDER — OXYCODONE HCL 5 MG PO TABS
ORAL_TABLET | ORAL | Status: AC
  Filled 2021-11-02: qty 1

## 2021-11-02 MED ORDER — LIDOCAINE 2% (20 MG/ML) 5 ML SYRINGE
INTRAMUSCULAR | Status: DC | PRN
  Administered 2021-11-02: 60 mg via INTRAVENOUS

## 2021-11-02 MED ORDER — PHENYLEPHRINE HCL-NACL 20-0.9 MG/250ML-% IV SOLN
INTRAVENOUS | Status: DC | PRN
  Administered 2021-11-02: 50 ug/min via INTRAVENOUS

## 2021-11-02 MED ORDER — METFORMIN HCL 500 MG PO TABS: 500.0000 mg | ORAL_TABLET | Freq: Every day | ORAL | Status: DC

## 2021-11-02 MED ORDER — ONDANSETRON HCL 4 MG/2ML IJ SOLN
INTRAMUSCULAR | Status: DC | PRN
  Administered 2021-11-02: 4 mg via INTRAVENOUS

## 2021-11-02 MED ORDER — ALBUMIN HUMAN 5 % IV SOLN: INTRAVENOUS | Status: DC | PRN

## 2021-11-02 MED ORDER — ONDANSETRON HCL 4 MG/2ML IJ SOLN
INTRAMUSCULAR | Status: AC
  Filled 2021-11-02: qty 2

## 2021-11-02 MED ORDER — SODIUM CHLORIDE 0.9 % IV SOLN: 250.0000 mL | INTRAVENOUS | Status: DC

## 2021-11-02 MED ORDER — CHLORHEXIDINE GLUCONATE 0.12 % MT SOLN
OROMUCOSAL | Status: AC
  Administered 2021-11-02: 15 mL via OROMUCOSAL
  Filled 2021-11-02: qty 15

## 2021-11-02 MED ORDER — CHLORHEXIDINE GLUCONATE 0.12 % MT SOLN
15.0000 mL | Freq: Once | OROMUCOSAL | Status: AC
Start: 2021-11-02 — End: 2021-11-02

## 2021-11-02 MED ORDER — BISACODYL 5 MG PO TBEC: 5.0000 mg | DELAYED_RELEASE_TABLET | Freq: Every day | ORAL | Status: DC | PRN

## 2021-11-02 MED ORDER — ASCORBIC ACID 500 MG PO TABS: 500.0000 mg | ORAL_TABLET | ORAL | Status: DC

## 2021-11-02 MED ORDER — CEFAZOLIN SODIUM-DEXTROSE 2-4 GM/100ML-% IV SOLN
2.0000 g | INTRAVENOUS | Status: AC
  Administered 2021-11-02: 2 g via INTRAVENOUS

## 2021-11-02 MED ORDER — LACTATED RINGERS IV SOLN: INTRAVENOUS | Status: DC

## 2021-11-02 MED ORDER — SENNOSIDES-DOCUSATE SODIUM 8.6-50 MG PO TABS: 1.0000 | ORAL_TABLET | Freq: Every evening | ORAL | Status: DC | PRN

## 2021-11-02 MED ORDER — ACETAMINOPHEN 10 MG/ML IV SOLN
1000.0000 mg | Freq: Once | INTRAVENOUS | Status: DC | PRN
  Administered 2021-11-02: 1000 mg via INTRAVENOUS

## 2021-11-02 MED ORDER — PROPOFOL 10 MG/ML IV BOLUS
INTRAVENOUS | Status: DC | PRN
  Administered 2021-11-02: 150 mg via INTRAVENOUS
  Administered 2021-11-02: 50 mg via INTRAVENOUS

## 2021-11-02 MED ORDER — SODIUM CHLORIDE 0.9% FLUSH: 3.0000 mL | INTRAVENOUS | Status: DC | PRN

## 2021-11-02 MED ORDER — HEMOSTATIC AGENTS (NO CHARGE) OPTIME
TOPICAL | Status: DC | PRN
  Administered 2021-11-02: 1 via TOPICAL

## 2021-11-02 MED ORDER — ACETAMINOPHEN 650 MG RE SUPP: 650.0000 mg | RECTAL | Status: DC | PRN

## 2021-11-02 MED ORDER — OXYCODONE-ACETAMINOPHEN 5-325 MG PO TABS: 1.0000 | ORAL_TABLET | ORAL | 0 refills | Status: DC | PRN

## 2021-11-02 MED ORDER — BASAGLAR KWIKPEN 100 UNIT/ML ~~LOC~~ SOPN: 31.0000 [IU] | PEN_INJECTOR | Freq: Every morning | SUBCUTANEOUS | Status: DC

## 2021-11-02 MED ORDER — ONDANSETRON HCL 4 MG PO TABS: 4.0000 mg | ORAL_TABLET | Freq: Four times a day (QID) | ORAL | Status: DC | PRN

## 2021-11-02 MED ORDER — TIMOLOL MALEATE 0.25 % OP SOLN: 1.0000 [drp] | Freq: Every day | OPHTHALMIC | Status: DC

## 2021-11-02 MED ORDER — AMISULPRIDE (ANTIEMETIC) 5 MG/2ML IV SOLN: 10.0000 mg | Freq: Once | INTRAVENOUS | Status: DC | PRN

## 2021-11-02 MED ORDER — POVIDONE-IODINE 7.5 % EX SOLN
Freq: Once | CUTANEOUS | Status: DC
Start: 2021-11-02 — End: 2021-11-02
  Filled 2021-11-02: qty 118

## 2021-11-02 MED ORDER — TESTOSTERONE 20.25 MG/ACT (1.62%) TD GEL: 2.0000 | Freq: Every day | TRANSDERMAL | Status: DC

## 2021-11-02 MED ORDER — FENTANYL CITRATE (PF) 250 MCG/5ML IJ SOLN
INTRAMUSCULAR | Status: AC
  Filled 2021-11-02: qty 5

## 2021-11-02 MED ORDER — PHENYLEPHRINE 80 MCG/ML (10ML) SYRINGE FOR IV PUSH (FOR BLOOD PRESSURE SUPPORT)
PREFILLED_SYRINGE | INTRAVENOUS | Status: DC | PRN
  Administered 2021-11-02: 80 ug via INTRAVENOUS
  Administered 2021-11-02: 160 ug via INTRAVENOUS
  Administered 2021-11-02 (×2): 80 ug via INTRAVENOUS

## 2021-11-02 MED ORDER — ROSUVASTATIN CALCIUM 5 MG PO TABS: 10.0000 mg | ORAL_TABLET | Freq: Every day | ORAL | Status: DC

## 2021-11-02 MED ORDER — ADULT MULTIVITAMIN W/MINERALS CH: 1.0000 | ORAL_TABLET | Freq: Every day | ORAL | Status: DC

## 2021-11-02 MED ORDER — FENTANYL CITRATE (PF) 100 MCG/2ML IJ SOLN
25.0000 ug | INTRAMUSCULAR | Status: DC | PRN
  Administered 2021-11-02: 50 ug via INTRAVENOUS
  Administered 2021-11-02: 25 ug via INTRAVENOUS
  Administered 2021-11-02: 50 ug via INTRAVENOUS
  Administered 2021-11-02: 25 ug via INTRAVENOUS

## 2021-11-02 MED ORDER — ORAL CARE MOUTH RINSE: 15.0000 mL | Freq: Once | OROMUCOSAL | Status: AC

## 2021-11-02 MED ORDER — ROCURONIUM BROMIDE 10 MG/ML (PF) SYRINGE
PREFILLED_SYRINGE | INTRAVENOUS | Status: AC
  Filled 2021-11-02: qty 10

## 2021-11-02 MED ORDER — CEFAZOLIN SODIUM-DEXTROSE 2-4 GM/100ML-% IV SOLN
INTRAVENOUS | Status: AC
  Filled 2021-11-02: qty 100

## 2021-11-02 MED ORDER — MIDAZOLAM HCL 2 MG/2ML IJ SOLN
INTRAMUSCULAR | Status: AC
  Filled 2021-11-02: qty 2

## 2021-11-02 MED ORDER — EPHEDRINE SULFATE-NACL 50-0.9 MG/10ML-% IV SOSY
PREFILLED_SYRINGE | INTRAVENOUS | Status: DC | PRN
  Administered 2021-11-02: 5 mg via INTRAVENOUS

## 2021-11-02 MED ORDER — ZOLPIDEM TARTRATE 5 MG PO TABS: 5.0000 mg | ORAL_TABLET | Freq: Every evening | ORAL | Status: DC | PRN

## 2021-11-02 MED ORDER — THROMBIN 20000 UNITS EX KIT
PACK | CUTANEOUS | Status: AC
  Filled 2021-11-02: qty 1

## 2021-11-02 MED ORDER — POTASSIUM CHLORIDE IN NACL 20-0.9 MEQ/L-% IV SOLN: INTRAVENOUS | Status: DC

## 2021-11-02 MED ORDER — OXYCODONE-ACETAMINOPHEN 5-325 MG PO TABS: 1.0000 | ORAL_TABLET | Freq: Four times a day (QID) | ORAL | Status: DC | PRN

## 2021-11-02 MED ORDER — METHOCARBAMOL 500 MG PO TABS: 500.0000 mg | ORAL_TABLET | Freq: Four times a day (QID) | ORAL | Status: DC | PRN

## 2021-11-02 MED ORDER — FLEET ENEMA 7-19 GM/118ML RE ENEM: 1.0000 | ENEMA | Freq: Once | RECTAL | Status: DC | PRN

## 2021-11-02 MED ORDER — MIDAZOLAM HCL 2 MG/2ML IJ SOLN
INTRAMUSCULAR | Status: DC | PRN
  Administered 2021-11-02: 2 mg via INTRAVENOUS

## 2021-11-02 MED ORDER — BUPIVACAINE HCL (PF) 0.25 % IJ SOLN
INTRAMUSCULAR | Status: AC
  Filled 2021-11-02: qty 30

## 2021-11-02 MED ORDER — DOCUSATE SODIUM 100 MG PO CAPS: 100.0000 mg | ORAL_CAPSULE | Freq: Two times a day (BID) | ORAL | Status: DC

## 2021-11-02 SURGICAL SUPPLY — 80 items
AGENT HMST KT MTR STRL THRMB (HEMOSTASIS)
APL SKNCLS STERI-STRIP NONHPOA (GAUZE/BANDAGES/DRESSINGS) ×1
BAG COUNTER SPONGE SURGICOUNT (BAG) ×3 IMPLANT
BAG SPNG CNTER NS LX DISP (BAG) ×1
BENZOIN TINCTURE PRP APPL 2/3 (GAUZE/BANDAGES/DRESSINGS) ×3 IMPLANT
BIT DRILL NEURO 2X3.1 SFT TUCH (MISCELLANEOUS) ×2 IMPLANT
BIT DRILL SRG 14X2.2XFLT CHK (BIT) IMPLANT
BIT DRL SRG 14X2.2XFLT CHK (BIT) ×1
BLADE CLIPPER SURG (BLADE) ×3 IMPLANT
BLADE SURG 15 STRL LF DISP TIS (BLADE) ×2 IMPLANT
BLADE SURG 15 STRL SS (BLADE) ×2
BONE VIVIGEN FORMABLE 1.3CC (Bone Implant) ×4 IMPLANT
BUR MATCHSTICK NEURO 3.0 LAGG (BURR) IMPLANT
CAGE NANOLOCK ENDOSKELETON 7 S (Cage) ×1 IMPLANT
CARTRIDGE OIL MAESTRO DRILL (MISCELLANEOUS) ×2 IMPLANT
COVER SURGICAL LIGHT HANDLE (MISCELLANEOUS) ×3 IMPLANT
DECANTER SPIKE VIAL GLASS SM (MISCELLANEOUS) ×3 IMPLANT
DIFFUSER DRILL AIR PNEUMATIC (MISCELLANEOUS) ×3 IMPLANT
DRAIN JACKSON RD 7FR 3/32 (WOUND CARE) IMPLANT
DRAPE C-ARM 35X43 STRL (DRAPES) ×1 IMPLANT
DRAPE C-ARM 42X72 X-RAY (DRAPES) ×3 IMPLANT
DRAPE POUCH INSTRU U-SHP 10X18 (DRAPES) ×3 IMPLANT
DRAPE SURG 17X23 STRL (DRAPES) ×6 IMPLANT
DRILL BIT SKYLINE 14MM (BIT) ×2
DRILL NEURO 2X3.1 SOFT TOUCH (MISCELLANEOUS) ×2
DURAPREP 26ML APPLICATOR (WOUND CARE) ×3 IMPLANT
ELECT COATED BLADE 2.86 ST (ELECTRODE) ×2 IMPLANT
ELECT REM PT RETURN 9FT ADLT (ELECTROSURGICAL) ×2
ELECTRODE REM PT RTRN 9FT ADLT (ELECTROSURGICAL) ×2 IMPLANT
EVACUATOR SILICONE 100CC (DRAIN) IMPLANT
GAUZE 4X4 16PLY ~~LOC~~+RFID DBL (SPONGE) ×3 IMPLANT
GAUZE SPONGE 4X4 12PLY STRL (GAUZE/BANDAGES/DRESSINGS) ×3 IMPLANT
GAUZE SPONGE 4X4 12PLY STRL LF (GAUZE/BANDAGES/DRESSINGS) ×1 IMPLANT
GLOVE BIO SURGEON STRL SZ7 (GLOVE) ×3 IMPLANT
GLOVE BIO SURGEON STRL SZ8 (GLOVE) ×3 IMPLANT
GLOVE BIOGEL PI IND STRL 7.0 (GLOVE) ×4 IMPLANT
GLOVE BIOGEL PI IND STRL 8 (GLOVE) ×2 IMPLANT
GLOVE BIOGEL PI INDICATOR 7.0 (GLOVE) ×2
GLOVE BIOGEL PI INDICATOR 8 (GLOVE) ×1
GLOVE SURG ENC MOIS LTX SZ6.5 (GLOVE) ×3 IMPLANT
GOWN STRL REUS W/ TWL LRG LVL3 (GOWN DISPOSABLE) ×2 IMPLANT
GOWN STRL REUS W/ TWL XL LVL3 (GOWN DISPOSABLE) ×2 IMPLANT
GOWN STRL REUS W/TWL LRG LVL3 (GOWN DISPOSABLE) ×2
GOWN STRL REUS W/TWL XL LVL3 (GOWN DISPOSABLE) ×2
GRAFT BNE MATRIX VG FRMBL SM 1 (Bone Implant) IMPLANT
INTERLOCK LRDTC CRVCL VBR 7MM (Bone Implant) IMPLANT
IV CATH 14GX2 1/4 (CATHETERS) ×3 IMPLANT
KIT BASIN OR (CUSTOM PROCEDURE TRAY) ×3 IMPLANT
KIT TURNOVER KIT B (KITS) ×3 IMPLANT
LORDOTIC CERVICAL VBR 7MM SM (Bone Implant) ×4 IMPLANT
MANIFOLD NEPTUNE II (INSTRUMENTS) ×2 IMPLANT
NDL PRECISIONGLIDE 27X1.5 (NEEDLE) ×2 IMPLANT
NDL SPNL 20GX3.5 QUINCKE YW (NEEDLE) ×2 IMPLANT
NEEDLE PRECISIONGLIDE 27X1.5 (NEEDLE) ×2 IMPLANT
NEEDLE SPNL 20GX3.5 QUINCKE YW (NEEDLE) ×2 IMPLANT
NS IRRIG 1000ML POUR BTL (IV SOLUTION) ×3 IMPLANT
OIL CARTRIDGE MAESTRO DRILL (MISCELLANEOUS) ×2
PACK ORTHO CERVICAL (CUSTOM PROCEDURE TRAY) ×3 IMPLANT
PAD ARMBOARD 7.5X6 YLW CONV (MISCELLANEOUS) ×6 IMPLANT
PATTIES SURGICAL .5 X.5 (GAUZE/BANDAGES/DRESSINGS) IMPLANT
PATTIES SURGICAL .5 X1 (DISPOSABLE) IMPLANT
PIN DISTRACTION 14 (PIN) ×2 IMPLANT
PLATE SKYLINE 3LVL 57M SPINE (Plate) ×1 IMPLANT
POSITIONER HEAD DONUT 9IN (MISCELLANEOUS) ×3 IMPLANT
SCREW SKYLINE VAR OS 14MM (Screw) ×8 IMPLANT
SPONGE INTESTINAL PEANUT (DISPOSABLE) ×6 IMPLANT
SPONGE SURGIFOAM ABS GEL 100 (HEMOSTASIS) ×3 IMPLANT
STRIP CLOSURE SKIN 1/2X4 (GAUZE/BANDAGES/DRESSINGS) ×3 IMPLANT
SURGIFLO W/THROMBIN 8M KIT (HEMOSTASIS) IMPLANT
SUT MNCRL AB 4-0 PS2 18 (SUTURE) ×3 IMPLANT
SUT VIC AB 2-0 CT2 18 VCP726D (SUTURE) ×3 IMPLANT
SYR BULB IRRIG 60ML STRL (SYRINGE) ×3 IMPLANT
SYR CONTROL 10ML LL (SYRINGE) ×6 IMPLANT
TAPE CLOTH 4X10 WHT NS (GAUZE/BANDAGES/DRESSINGS) ×3 IMPLANT
TAPE UMBILICAL COTTON 1/8X30 (MISCELLANEOUS) ×3 IMPLANT
TOWEL GREEN STERILE (TOWEL DISPOSABLE) ×3 IMPLANT
TOWEL GREEN STERILE FF (TOWEL DISPOSABLE) ×3 IMPLANT
TRAY FOLEY MTR SLVR 16FR STAT (SET/KITS/TRAYS/PACK) ×3 IMPLANT
WATER STERILE IRR 1000ML POUR (IV SOLUTION) ×3 IMPLANT
YANKAUER SUCT BULB TIP NO VENT (SUCTIONS) ×3 IMPLANT

## 2021-11-02 NOTE — Anesthesia Preprocedure Evaluation (Addendum)
Anesthesia Evaluation  ?Patient identified by MRN, date of birth, ID band ?Patient awake ? ? ? ?Reviewed: ?Allergy & Precautions, NPO status , Patient's Chart, lab work & pertinent test results ? ?Airway ?Mallampati: III ? ?TM Distance: >3 FB ?Neck ROM: Limited ? ? ? Dental ?no notable dental hx. ? ?  ?Pulmonary ?sleep apnea and Continuous Positive Airway Pressure Ventilation ,  ?  ?Pulmonary exam normal ? ? ? ? ? ? ? Cardiovascular ?hypertension, Pt. on medications ?Normal cardiovascular exam ? ? ?  ?Neuro/Psych ?negative psych ROS  ? GI/Hepatic ?negative GI ROS, Neg liver ROS,   ?Endo/Other  ?diabetes, Oral Hypoglycemic Agents, Insulin Dependent ? Renal/GU ?negative Renal ROS  ? ?  ?Musculoskeletal ? ?(+) Arthritis ,  ? Abdominal ?(+) + obese,   ?Peds ? Hematology ?negative hematology ROS ?(+)   ?Anesthesia Other Findings ?moderate to severe neuroforaminal stenosis on the right at C4-5, C5-6, and C6-7 ? Reproductive/Obstetrics ? ?  ? ? ? ? ? ? ? ? ? ? ? ? ? ?  ?  ? ? ? ? ? ? ? ?Anesthesia Physical ?Anesthesia Plan ? ?ASA: 3 ? ?Anesthesia Plan: General  ? ?Post-op Pain Management:   ? ?Induction: Intravenous ? ?PONV Risk Score and Plan: 2 and Ondansetron, Dexamethasone, Midazolam and Treatment may vary due to age or medical condition ? ?Airway Management Planned: Oral ETT and Video Laryngoscope Planned ? ?Additional Equipment:  ? ?Intra-op Plan:  ? ?Post-operative Plan: Extubation in OR ? ?Informed Consent: I have reviewed the patients History and Physical, chart, labs and discussed the procedure including the risks, benefits and alternatives for the proposed anesthesia with the patient or authorized representative who has indicated his/her understanding and acceptance.  ? ? ? ?Dental advisory given ? ?Plan Discussed with: CRNA ? ?Anesthesia Plan Comments:   ? ? ? ? ? ?Anesthesia Quick Evaluation ? ?

## 2021-11-02 NOTE — Anesthesia Postprocedure Evaluation (Signed)
Anesthesia Post Note ? ?Patient: BRENNDEN MASTEN ? ?Procedure(s) Performed: ANTERIOR CERVICAL DECOMPRESSION FUSION CERVICAL 4- CERVICAL 5, CERVICAL 5- CERVICAL 6, CERVICAL 6- CERVICAL 7 WITH INSTRUMENTATION AND ALLOGRAFT (Spine Cervical) ? ?  ? ?Patient location during evaluation: PACU ?Anesthesia Type: General ?Level of consciousness: awake ?Pain management: pain level controlled ?Vital Signs Assessment: post-procedure vital signs reviewed and stable ?Respiratory status: spontaneous breathing, nonlabored ventilation, respiratory function stable and patient connected to nasal cannula oxygen ?Cardiovascular status: blood pressure returned to baseline and stable ?Postop Assessment: no apparent nausea or vomiting ?Anesthetic complications: no ? ? ?No notable events documented. ? ?Last Vitals:  ?Vitals:  ? 11/02/21 1555 11/02/21 1615  ?BP:  125/81  ?Pulse: 97 94  ?Resp: 13 15  ?Temp:  36.8 ?C  ?SpO2: 94% 92%  ?  ?Last Pain:  ?Vitals:  ? 11/02/21 1615  ?TempSrc:   ?PainSc: 3   ? ? ?  ?  ?  ?  ?  ?  ? ?Odai Wimmer P Coby Antrobus ? ? ? ? ?

## 2021-11-02 NOTE — Transfer of Care (Signed)
Immediate Anesthesia Transfer of Care Note ? ?Patient: James Bullock ? ?Procedure(s) Performed: ANTERIOR CERVICAL DECOMPRESSION FUSION CERVICAL 4- CERVICAL 5, CERVICAL 5- CERVICAL 6, CERVICAL 6- CERVICAL 7 WITH INSTRUMENTATION AND ALLOGRAFT (Spine Cervical) ? ?Patient Location: PACU ? ?Anesthesia Type:General ? ?Level of Consciousness: drowsy, patient cooperative and responds to stimulation ? ?Airway & Oxygen Therapy: Patient Spontanous Breathing and Patient connected to nasal cannula oxygen ? ?Post-op Assessment: Report given to RN and Post -op Vital signs reviewed and stable ? ?Post vital signs: Reviewed and stable ? ?Last Vitals:  ?Vitals Value Taken Time  ?BP 149/74 11/02/21 1423  ?Temp    ?Pulse 98 11/02/21 1426  ?Resp 11 11/02/21 1426  ?SpO2 96 % 11/02/21 1426  ?Vitals shown include unvalidated device data. ? ?Last Pain:  ?Vitals:  ? 11/02/21 0909  ?TempSrc:   ?PainSc: 0-No pain  ?   ? ?  ? ?Complications: No notable events documented. ?

## 2021-11-02 NOTE — H&P (Signed)
? ? ? ?PREOPERATIVE H&P ? ?Chief Complaint: Right arm pain and weakness ? ?HPI: ?James Bullock is a 66 y.o. male who presents with ongoing pain and weakness involving the right shoulder and arm ? ?MRI reveals severe stenosis and cord compression spanning C4-C7 ? ?Patient has failed multiple forms of conservative care and continues to have pain (see office notes for additional details regarding the patient's full course of treatment) ? ?Past Medical History:  ?Diagnosis Date  ? Arthritis   ? knees  ? Diabetes mellitus without complication (HCC)   ? type 2  ? Diverticulosis   ? History of colon polyps   ? benign  ? Hyperlipemia   ? takes Crestor daily  ? Hypertension   ? takes Losartan daily  ? Joint pain   ? Sleep apnea   ? uses CPAP nightly  ? ?Past Surgical History:  ?Procedure Laterality Date  ? CHONDROPLASTY Left 08/13/2014  ? Procedure: CHONDROPLASTY;  Surgeon: Nestor Lewandowsky, MD;  Location: [facility];  Service: Orthopedics;  Laterality: Left;  ? COLONOSCOPY    ? x 3  ? KNEE ARTHROSCOPY WITH MEDIAL MENISECTOMY Left 08/13/2014  ? Procedure: LEFT ARTHROSCOPY KNEE PARTIAL MEDIAL MENISECTOMY CHONDDROPLASTY;  Surgeon: Nestor Lewandowsky, MD;  Location: [facility];  Service: Orthopedics;  Laterality: Left;  ? NERVE AND ARTERY REPAIR Right 01/08/2012  ? index finger  ? SHOULDER ARTHROSCOPY Right 07/16/1981  ? TOTAL HIP ARTHROPLASTY Left 2019  ? TOTAL KNEE ARTHROPLASTY Left 05/18/2015  ? Procedure: TOTAL KNEE ARTHROPLASTY;  Surgeon: Gean Birchwood, MD;  Location: [facility] OR;  Service: Orthopedics;  Laterality: Left;  ? TOTAL KNEE ARTHROPLASTY Right 2017  ? ?Social History  ? ?Socioeconomic History  ? Marital status: Married  ?  Spouse name: Not on file  ? Number of children: 1  ? Years of education: Not on file  ? Highest education level: Not on file  ?Occupational History  ? Not on file  ?Tobacco Use  ? Smoking status: Never  ? Smokeless tobacco: Never  ?Vaping Use  ? Vaping Use: Never used   ?Substance and Sexual Activity  ? Alcohol use: Yes  ?  Alcohol/week: 1.0 standard drink  ?  Types: 1 Glasses of wine per week  ?  Comment: maybe one glass of wine every other week  ? Drug use: No  ? Sexual activity: Yes  ?Other Topics Concern  ? Not on file  ?Social History Narrative  ? Not on file  ? ?Social Determinants of Health  ? ?Financial Resource Strain: Not on file  ?Food Insecurity: Not on file  ?Transportation Needs: Not on file  ?Physical Activity: Not on file  ?Stress: Not on file  ?Social Connections: Not on file  ? ?Family History  ?Problem Relation Age of Onset  ? Hypertension Mother   ? Hypertension Father   ? Diabetes type II Father   ? ?Allergies  ?Allergen Reactions  ? Lipitor [Atorvastatin] Other (See Comments)  ?  Muscle Aches  ? Dilaudid [Hydromorphone Hcl] Rash  ? ?Prior to Admission medications   ?Medication Sig Start Date End Date Taking? Authorizing Provider  ?acetaminophen (TYLENOL) 650 MG CR tablet Take 1,300 mg by mouth every 8 (eight) hours as needed for pain.   Yes [provider]  ?Insulin Glargine (BASAGLAR KWIKPEN) 100 UNIT/ML Inject 31 Units into the skin in the morning. 08/04/21  Yes [provider]  ?metFORMIN (GLUCOPHAGE) 500 MG tablet Take 500 mg by mouth daily.  09/20/21  Yes [provider]  ?Multiple Vitamin (MULTIVITAMIN WITH MINERALS) TABS Take 1 tablet by mouth daily.   Yes [provider]  ?olmesartan (BENICAR) 40 MG tablet Take 40 mg by mouth daily.   Yes [provider]  ?Omega-3 Fatty Acids (FISH OIL ULTRA) 1400 MG CAPS Take 1,400 mg by mouth daily.   Yes [provider]  ?rosuvastatin (CRESTOR) 10 MG tablet Take 10 mg by mouth daily.   Yes [provider]  ?Testosterone 20.25 MG/ACT (1.62%) GEL Apply 2 Pump topically daily. 10/13/21  Yes [provider]  ?timolol (TIMOPTIC) 0.25 % ophthalmic solution Place 1 drop into both eyes daily. 08/13/21  Yes [provider]  ?vitamin C (ASCORBIC ACID)  500 MG tablet Take 500 mg by mouth every other day.   Yes [provider]  ?aspirin EC 325 MG tablet Take 1 tablet (325 mg total) by mouth 2 (two) times daily. ?Patient not taking: Reported on 10/20/2021 05/18/15   Allena Katz, PA-C  ?methocarbamol (ROBAXIN) 500 MG tablet Take 1 tablet (500 mg total) by mouth 2 (two) times daily with a meal. ?Patient not taking: Reported on 11/29/2015 05/18/15   Allena Katz, PA-C  ?oxyCODONE-acetaminophen (ROXICET) 5-325 MG tablet Take 1 tablet by mouth every 4 (four) hours as needed. ?Patient not taking: Reported on 11/29/2015 05/18/15   Allena Katz, PA-C  ? ? ? ?All other systems have been reviewed and were otherwise negative with the exception of those mentioned in the HPI and as above. ? ?Physical Exam: ?There were no vitals filed for this visit. ? ?There is no height or weight on file to calculate BMI. ? ?General: Alert, no acute distress ?Cardiovascular: No pedal edema ?Respiratory: No cyanosis, no use of accessory musculature ?Skin: No lesions in the area of chief complaint ?Neurologic: Sensation intact distally ?Psychiatric: Patient is competent for consent with normal mood and affect ?Lymphatic: No axillary or cervical lymphadenopathy ? ? ?Assessment/Plan: ?Profound weakness involving the right shoulder and right biceps.  The patient is noted to have moderate to severe neuroforaminal stenosis on the right at C4-5, C5-6, and C6-7 ?Plan for Procedure(s): ?ANTERIOR CERVICAL DECOMPRESSION FUSION CERVICAL 4- CERVICAL 5, CERVICAL 5- CERVICAL 6, CERVICAL 6- CERVICAL 7 WITH INSTRUMENTATION AND ALLOGRAFT ? ? ?Jackelyn Hoehn, MD ?11/02/2021 ?8:13 AM  ?

## 2021-11-02 NOTE — Anesthesia Procedure Notes (Signed)
Procedure Name: Intubation ?Date/Time: 11/02/2021 11:29 AM ?Performed by: Michele Rockers, CRNA ?Pre-anesthesia Checklist: Patient identified, Patient being monitored, Timeout performed, Emergency Drugs available and Suction available ?Patient Re-evaluated:Patient Re-evaluated prior to induction ?Oxygen Delivery Method: Circle system utilized ?Preoxygenation: Pre-oxygenation with 100% oxygen ?Induction Type: IV induction ?Ventilation: Oral airway inserted - appropriate to patient size and Two handed mask ventilation required ?Laryngoscope Size: Mac, Glidescope and 4 ?Grade View: Grade I ?Tube type: Oral ?Tube size: 7.5 mm ?Number of attempts: 1 ?Airway Equipment and Method: Stylet and Video-laryngoscopy ?Placement Confirmation: ETT inserted through vocal cords under direct vision, positive ETCO2 and breath sounds checked- equal and bilateral ?Secured at: 22 cm ?Tube secured with: Tape ?Dental Injury: Teeth and Oropharynx as per pre-operative assessment  ?Difficulty Due To: Difficult Airway-  due to neck instability ? ? ? ? ?

## 2021-11-02 NOTE — Op Note (Signed)
PATIENT NAME: James Bullock  ? ?MEDICAL RECORD NO.:   [id number]  ?  ?DATE OF BIRTH: 09-28-1955 ?  ?DATE OF PROCEDURE: 11/02/2021  ?  ?                            OPERATIVE REPORT ?  ?  ?PREOPERATIVE DIAGNOSES: ?1. Right-sided cervical radiculopathy. ?2. Spinal stenosis spanning C4-C7. ?  ?POSTOPERATIVE DIAGNOSES: ?1. Right-sided cervical radiculopathy. ?2. Spinal stenosis spanning C4-C7. ?  ?PROCEDURE: ?1. Anterior cervical decompression and fusion C4/5, C5/6, C6/7. ?2. Placement of anterior instrumentation, C4-C7. ?3. Insertion of interbody device x 3 (64mm Titan intervertebral spacers). ?4. Intraoperative use of fluoroscopy. ?5. Use of morselized allograft - ViviGen. ?  ?SURGEON:  Estill Bamberg, MD ?  ?ASSISTANT:  Jason Coop, PA-C. ?  ?ANESTHESIA:  General endotracheal anesthesia. ?  ?COMPLICATIONS:  None. ?  ?DISPOSITION:  Stable. ?  ?ESTIMATED BLOOD LOSS:  Minimal. ?  ?INDICATIONS FOR SURGERY:  Briefly, James Bullock is a pleasant 66 y.o. year- ?old patient, who did present to me with pain in the right arm, in addition to weakness involving the right shoulder and right bicep. The patient's MRI did reveal the findings noted above.  Given the ?patient's ongoing rather pain and weakness, and lack of improvement with ?appropriate treatment measures, we did discuss proceeding with the ?procedure noted above.  The patient was fully aware of the risks and ?limitations of surgery as outlined in my preoperative note. ?  ?OPERATIVE DETAILS:  On 11/02/2021, the patient was brought to ?surgery and general endotracheal anesthesia was administered.  The ?patient was placed supine on the hospital bed. The neck was gently ?extended.  All bony prominences were meticulously padded.  The neck was ?prepped and draped in the usual sterile fashion.  At this point, I did ?make a left-sided transverse incision.  The platysma was incised.  A ?Smith-Robinson approach was used and the anterior spine was identified. ?A self-retaining retractor  was placed.  I then subperiosteally exposed ?the vertebral bodies from C4-C7.  Caspar pins were then placed into the ?C6 and C7 vertebral bodies and distraction was applied.  A thorough and ?complete C6-7 intervertebral diskectomy was performed.  The posterior ?longitudinal ligament was identified and entered using a nerve hook.  I ?then used #1 followed by #2 Kerrison to perform a thorough and complete ?intervertebral diskectomy.  The spinal canal was thoroughly ?decompressed, as was the right neuroforamen.  The endplates were then ?prepared and the appropriate-sized intervertebral spacer was then packed ?with ViviGen and tamped into position in the usual fashion.  The lower ?Caspar pin was then removed and placed into the C5 vertebral body and ?once again, distraction was applied across the C5-6 intervertebral ?space.  I then again performed a thorough and complete diskectomy, ?thoroughly decompressing the spinal canal and right neuroforamen.  After ?preparing the endplates, the appropriate-sized intervertebral spacer was ?packed with ViviGen and tamped into position.  The lower ?Caspar pin was then removed and placed into the C4 vertebral body and ?once again, distraction was applied across the C4-5 intervertebral ?space.  I then again performed a thorough and complete diskectomy, ?thoroughly decompressing the spinal canal and right neuroforamen.  After ?preparing the endplates, the appropriate-sized intervertebral spacer was ?packed with ViviGen and tamped into position.  The Caspar pins then were ?removed and bone wax was placed in their place.  The appropriate-sized ?anterior cervical plate was placed over the anterior spine.  14  mm ?variable angle screws were placed, 2 in each vertebral body from C4-C7 ?for a total of 8 vertebral body screws.  The screws were then locked to ?the plate using the Cam locking mechanism.  I was very pleased with the ?final fluoroscopic images.  The wound was then irrigated.  The  wound was ?then explored for any undue bleeding and there was no bleeding noted. ?The wound was then closed in layers using 2-0 Vicryl, followed by 4-0 ?Monocryl.  Benzoin and Steri-Strips were applied, followed by sterile ?dressing.  All instrument counts were correct at the termination of the ?procedure. ?  ?Of note, Jason Coop, PA-C, was my assistant throughout surgery, and ?did aid in retraction, suctioning, placement of the hardware, and closure from start to finish. ?  ?  ?  ?Estill Bamberg, MD  ?

## 2021-11-03 ENCOUNTER — Encounter (HOSPITAL_COMMUNITY): Payer: Self-pay | Admitting: Orthopedic Surgery

## 2021-11-03 MED FILL — Thrombin For Soln Kit 20000 Unit: CUTANEOUS | Qty: 1 | Status: AC

## 2021-11-03 NOTE — Progress Notes (Signed)
 Fentanyl wasted with [place]. ?

## 2021-11-08 NOTE — Discharge Summary (Signed)
? ? ?Patient ID: ?James Bullock ?MRN: [id number] ?DOB/AGE: 02/13/1956 66 y.o. ? ?Admit date: 11/02/2021 ?Discharge date: 11/02/2021 ? ?Admission Diagnoses:  ?Principal Problem: ?  S/P cervical discectomy ?Active Problems: ?  Radiculopathy ? ? ?Discharge Diagnoses:  ?Same ? ?Past Medical History:  ?Diagnosis Date  ? Arthritis   ? knees  ? Diabetes mellitus without complication (HCC)   ? type 2  ? Diverticulosis   ? History of colon polyps   ? benign  ? Hyperlipemia   ? takes Crestor daily  ? Hypertension   ? takes Losartan daily  ? Joint pain   ? Sleep apnea   ? uses CPAP nightly  ? ? ?Surgeries: Procedure(s): ?ANTERIOR CERVICAL DECOMPRESSION FUSION CERVICAL 4- CERVICAL 5, CERVICAL 5- CERVICAL 6, CERVICAL 6- CERVICAL 7 WITH INSTRUMENTATION AND ALLOGRAFT on 11/02/2021 ?  ?Consultants: None ? ?Discharged Condition: Improved ? ?Hospital Course: James Bullock is an 66 y.o. male who was admitted 11/02/2021 for operative treatment of S/P cervical discectomy. Patient has severe unremitting pain that affects sleep, daily activities, and work/hobbies. After pre-op clearance the patient was taken to the operating room on 11/02/2021 and underwent  Procedure(s): ?ANTERIOR CERVICAL DECOMPRESSION FUSION CERVICAL 4- CERVICAL 5, CERVICAL 5- CERVICAL 6, CERVICAL 6- CERVICAL 7 WITH INSTRUMENTATION AND ALLOGRAFT.   ? ?Patient was given perioperative antibiotics:  ?Anti-infectives (From admission, onward)  ? ? Start     Dose/Rate Route Frequency Ordered Stop  ? 11/02/21 1700  ceFAZolin (ANCEF) IVPB 2g/100 mL premix  Status:  Discontinued       ? 2 g ?200 mL/hr over 30 Minutes Intravenous Every 8 hours 11/02/21 1649 11/02/21 2157  ? 11/02/21 1030  ceFAZolin (ANCEF) IVPB 2g/100 mL premix       ? 2 g ?200 mL/hr over 30 Minutes Intravenous On call to O.R. 11/02/21 9798 11/02/21 1119  ? 11/02/21 0856  ceFAZolin (ANCEF) 2-4 GM/100ML-% IVPB       ?Note to Pharmacy: Lorette Ang, Meredit: cabinet override  ?    11/02/21 0856 11/02/21 1119  ? ?  ?   ? ?Patient was given sequential compression devices, early ambulation to prevent DVT. ? ?Patient benefited maximally from hospital stay and there were no complications.   ? ?Recent vital signs: BP 125/81 (BP Location: Right Arm)   Pulse 94   Temp 98.2 ?F (36.8 ?C)   Resp 15   Ht 5\' 11"  (1.803 m)   Wt 105.7 kg   SpO2 92%   BMI 32.50 kg/m?   ? ?Discharge Medications:   ?Allergies as of 11/02/2021   ? ?   Reactions  ? Lipitor [atorvastatin] Other (See Comments)  ? Muscle Aches  ? Dilaudid [hydromorphone Hcl] Rash  ? ?  ? ?  ?Medication List  ?  ? ?TAKE these medications   ? ?acetaminophen 650 MG CR tablet ?Commonly known as: TYLENOL ?Take 1,300 mg by mouth every 8 (eight) hours as needed for pain. ?  ?Basaglar KwikPen 100 UNIT/ML ?Inject 31 Units into the skin in the morning. ?  ?metFORMIN 500 MG tablet ?Commonly known as: GLUCOPHAGE ?Take 500 mg by mouth daily. ?  ?methocarbamol 500 MG tablet ?Commonly known as: Robaxin ?Take 1-2 tablets (500-1,000 mg total) by mouth 2 (two) times daily with a meal. ?What changed: how much to take ?  ?multivitamin with minerals Tabs tablet ?Take 1 tablet by mouth daily. ?  ?olmesartan 40 MG tablet ?Commonly known as: BENICAR ?Take 40 mg by mouth daily. ?  ?oxyCODONE-acetaminophen 5-325  MG tablet ?Commonly known as: Roxicet ?Take 1 tablet by mouth every 4 (four) hours as needed. ?  ?rosuvastatin 10 MG tablet ?Commonly known as: CRESTOR ?Take 10 mg by mouth daily. ?  ?Testosterone 20.25 MG/ACT (1.62%) Gel ?Apply 2 Pump topically daily. ?  ?timolol 0.25 % ophthalmic solution ?Commonly known as: TIMOPTIC ?Place 1 drop into both eyes daily. ?  ?vitamin C 500 MG tablet ?Commonly known as: ASCORBIC ACID ?Take 500 mg by mouth every other day. ?  ? ?  ? ? ?Diagnostic Studies: DG Cervical Spine 1 View ? ?Result Date: 11/02/2021 ?CLINICAL DATA:  C4-C7 ACDF EXAM: DG CERVICAL SPINE - 1 VIEW COMPARISON:  None. FINDINGS: Intraoperative images during C4-C7 ACDF. No evidence of immediate  hardware complication. IMPRESSION: Intraoperative images during C4-C7 ACDF. No evidence of immediate hardware complication. Electronically Signed   By: Caprice Renshaw M.D.   On: 11/02/2021 14:13  ? ?DG C-Arm 1-60 Min-No Report ? ?Result Date: 11/02/2021 ?Fluoroscopy was utilized by the requesting physician.  No radiographic interpretation.  ? ?DG C-Arm 1-60 Min-No Report ? ?Result Date: 11/02/2021 ?Fluoroscopy was utilized by the requesting physician.  No radiographic interpretation.  ? ?DG C-Arm 1-60 Min-No Report ? ?Result Date: 11/02/2021 ?Fluoroscopy was utilized by the requesting physician.  No radiographic interpretation.   ? ?Disposition: Discharge disposition: 01-Home or Self Care ? ? ? ? ? ? ?Discharge Instructions   ? ? Discharge patient   Complete by: As directed ?  ? Discharge disposition: 01-Home or Self Care  ? Discharge patient date: 11/02/2021  ? ?  ? ?S/P ACDF today, doing well, resolved arm symptoms, swallowing well, minimal well controlled neck pain, D/C same day  ?-Scripts for pain sent to pharmacy electronically  ?-D/C instructions sheet printed and in chart ?-D/C today  ?-F/U in office 2 weeks  ? ?Signed: ?Eilene Ghazi Glorine Hanratty ?11/08/2021, 12:44 PM ? ? ? ? ? ?  ?

## 2021-11-15 DIAGNOSIS — M4802 Spinal stenosis, cervical region: Secondary | ICD-10-CM | POA: Diagnosis not present

## 2021-11-15 DIAGNOSIS — M5412 Radiculopathy, cervical region: Secondary | ICD-10-CM | POA: Diagnosis not present

## 2021-11-24 DIAGNOSIS — M4802 Spinal stenosis, cervical region: Secondary | ICD-10-CM | POA: Diagnosis not present

## 2021-11-24 DIAGNOSIS — Z981 Arthrodesis status: Secondary | ICD-10-CM | POA: Diagnosis not present

## 2021-12-01 DIAGNOSIS — H524 Presbyopia: Secondary | ICD-10-CM | POA: Diagnosis not present

## 2021-12-12 DIAGNOSIS — Z9889 Other specified postprocedural states: Secondary | ICD-10-CM | POA: Diagnosis not present

## 2021-12-15 DIAGNOSIS — M4322 Fusion of spine, cervical region: Secondary | ICD-10-CM | POA: Diagnosis not present

## 2021-12-20 DIAGNOSIS — M4322 Fusion of spine, cervical region: Secondary | ICD-10-CM | POA: Diagnosis not present

## 2021-12-22 DIAGNOSIS — M4322 Fusion of spine, cervical region: Secondary | ICD-10-CM | POA: Diagnosis not present

## 2021-12-25 DIAGNOSIS — H5203 Hypermetropia, bilateral: Secondary | ICD-10-CM | POA: Diagnosis not present

## 2021-12-25 DIAGNOSIS — H524 Presbyopia: Secondary | ICD-10-CM | POA: Diagnosis not present

## 2021-12-25 DIAGNOSIS — H52223 Regular astigmatism, bilateral: Secondary | ICD-10-CM | POA: Diagnosis not present

## 2021-12-25 DIAGNOSIS — M4322 Fusion of spine, cervical region: Secondary | ICD-10-CM | POA: Diagnosis not present

## 2021-12-25 DIAGNOSIS — H40013 Open angle with borderline findings, low risk, bilateral: Secondary | ICD-10-CM | POA: Diagnosis not present

## 2021-12-29 DIAGNOSIS — M4322 Fusion of spine, cervical region: Secondary | ICD-10-CM | POA: Diagnosis not present

## 2022-01-01 DIAGNOSIS — M4322 Fusion of spine, cervical region: Secondary | ICD-10-CM | POA: Diagnosis not present

## 2022-01-03 DIAGNOSIS — M4322 Fusion of spine, cervical region: Secondary | ICD-10-CM | POA: Diagnosis not present

## 2022-01-10 DIAGNOSIS — G4733 Obstructive sleep apnea (adult) (pediatric): Secondary | ICD-10-CM | POA: Diagnosis not present

## 2022-01-19 DIAGNOSIS — M4322 Fusion of spine, cervical region: Secondary | ICD-10-CM | POA: Diagnosis not present

## 2022-01-23 DIAGNOSIS — Z9889 Other specified postprocedural states: Secondary | ICD-10-CM | POA: Diagnosis not present

## 2022-02-19 DIAGNOSIS — I10 Essential (primary) hypertension: Secondary | ICD-10-CM | POA: Diagnosis not present

## 2022-02-19 DIAGNOSIS — Z125 Encounter for screening for malignant neoplasm of prostate: Secondary | ICD-10-CM | POA: Diagnosis not present

## 2022-02-19 DIAGNOSIS — Z0000 Encounter for general adult medical examination without abnormal findings: Secondary | ICD-10-CM | POA: Diagnosis not present

## 2022-02-19 DIAGNOSIS — E291 Testicular hypofunction: Secondary | ICD-10-CM | POA: Diagnosis not present

## 2022-02-19 DIAGNOSIS — E782 Mixed hyperlipidemia: Secondary | ICD-10-CM | POA: Diagnosis not present

## 2022-02-19 DIAGNOSIS — Z1159 Encounter for screening for other viral diseases: Secondary | ICD-10-CM | POA: Diagnosis not present

## 2022-02-19 DIAGNOSIS — E1169 Type 2 diabetes mellitus with other specified complication: Secondary | ICD-10-CM | POA: Diagnosis not present

## 2022-04-02 ENCOUNTER — Encounter: Payer: Self-pay | Admitting: Cardiology

## 2022-04-02 ENCOUNTER — Ambulatory Visit: Payer: Medicare Other | Attending: Cardiology | Admitting: Cardiology

## 2022-04-02 VITALS — BP 136/80 | HR 97 | Ht 71.0 in | Wt 236.0 lb

## 2022-04-02 DIAGNOSIS — I10 Essential (primary) hypertension: Secondary | ICD-10-CM

## 2022-04-02 DIAGNOSIS — I4510 Unspecified right bundle-branch block: Secondary | ICD-10-CM | POA: Diagnosis not present

## 2022-04-02 NOTE — Progress Notes (Signed)
Cardiology Office Note:    Date:  04/02/2022   ID:  James Bullock, DOB [birth year], MRN [id number]  PCP:  Tally Joe, MD   [facility] Providers Cardiologist:  None     Referring MD: Tally Joe, MD    History of Present Illness:    James Bullock is a 66 y.o. male here for the evaluation of incomplete right bundle branch block at the request of Dr. Azucena Cecil.   Hypertension-on olmesartan since 2019.  Most of been averaging around 131/76.  Most recent at home reading was 125/79.  Has hyperlipidemia on Crestor 10 mg a day.  On EKG 02/19/2022 personally reviewed and interpreted showed incomplete right bundle branch block.  QRS duration was 110 ms. He was counseled that typically no additional treatment is warranted for this issue but was interested in considering secondary evaluation.  83 bpm prior EKG on 10/25/2021 was very similar.  He is also treated for sleep apnea with CPAP.  Retired past year.   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  Daun Peacock former patient of mine.   Past Medical History:  Diagnosis Date   Arthritis    knees   Diabetes mellitus without complication (HCC)    type 2   Diverticulosis    History of colon polyps    benign   Hyperlipemia    takes Crestor daily   Hypertension    takes Losartan daily   Incomplete RBBB    Joint pain    Low testosterone    Sleep apnea    uses CPAP nightly    Past Surgical History:  Procedure Laterality Date   ANTERIOR CERVICAL DECOMPRESSION/DISCECTOMY FUSION 4 LEVELS N/A 11/02/2021   Procedure: ANTERIOR CERVICAL DECOMPRESSION FUSION CERVICAL 4- CERVICAL 5, CERVICAL 5- CERVICAL 6, CERVICAL 6- CERVICAL 7 WITH INSTRUMENTATION AND ALLOGRAFT;  Surgeon: Estill Bamberg, MD;  Location: [facility] OR;  Service: Orthopedics;  Laterality: N/A;   CHONDROPLASTY Left 08/13/2014   Procedure: CHONDROPLASTY;  Surgeon: Nestor Lewandowsky, MD;  Location: [facility];  Service: Orthopedics;   Laterality: Left;   COLONOSCOPY     x 3   KNEE ARTHROSCOPY WITH MEDIAL MENISECTOMY Left 08/13/2014   Procedure: LEFT ARTHROSCOPY KNEE PARTIAL MEDIAL MENISECTOMY CHONDDROPLASTY;  Surgeon: Nestor Lewandowsky, MD;  Location: [facility];  Service: Orthopedics;  Laterality: Left;   NERVE AND ARTERY REPAIR Right 01/08/2012   index finger   SHOULDER ARTHROSCOPY Right 07/16/1981   TOTAL HIP ARTHROPLASTY Left 2019   TOTAL KNEE ARTHROPLASTY Left 05/18/2015   Procedure: TOTAL KNEE ARTHROPLASTY;  Surgeon: Gean Birchwood, MD;  Location: [facility] OR;  Service: Orthopedics;  Laterality: Left;   TOTAL KNEE ARTHROPLASTY Right 2017    Current Medications: Current Meds  Medication Sig   Insulin Glargine (BASAGLAR KWIKPEN) 100 UNIT/ML Inject 31 Units into the skin in the morning.   metFORMIN (GLUCOPHAGE) 500 MG tablet Take 500 mg by mouth daily.   Multiple Vitamin (MULTIVITAMIN WITH MINERALS) TABS Take 1 tablet by mouth daily.   olmesartan (BENICAR) 40 MG tablet Take 40 mg by mouth daily.   rosuvastatin (CRESTOR) 10 MG tablet Take 10 mg by mouth daily.   Testosterone 20.25 MG/ACT (1.62%) GEL Apply 2 Pump topically daily.   timolol (TIMOPTIC) 0.25 % ophthalmic solution Place 1 drop into both eyes daily.     Allergies:   Lipitor [atorvastatin] and Dilaudid [hydromorphone hcl]   Social History   Socioeconomic History   Marital status: Married  Spouse name: Not on file   Number of children: 1   Years of education: Not on file   Highest education level: Not on file  Occupational History   Not on file  Tobacco Use   Smoking status: Never   Smokeless tobacco: Never  Vaping Use   Vaping Use: Never used  Substance and Sexual Activity   Alcohol use: Yes    Alcohol/week: 1.0 standard drink of alcohol    Types: 1 Glasses of wine per week    Comment: maybe one glass of wine every other week   Drug use: No   Sexual activity: Yes  Other Topics Concern   Not on file  Social History Narrative    Not on file   Social Determinants of Health   Financial Resource Strain: Not on file  Food Insecurity: Not on file  Transportation Needs: Not on file  Physical Activity: Not on file  Stress: Not on file  Social Connections: Not on file     Family History: The patient's family history includes Diabetes type II in his father; Hypertension in his father and mother.  ROS:   Please see the history of present illness.    No fevers chills nausea vomiting syncope bleeding all other systems reviewed and are negative.  EKGs/Labs/Other Studies Reviewed:    The following studies were reviewed today: Outside records reviewed, prior EKGs reviewed  EKG:  EKG is  ordered today.  The ekg ordered today demonstrates today normal sinus rhythm heart rate 97 bpm with QRS duration 104 ms essentially normal.  No real change from prior EKG.  Does have small S wave in lead I, could be considered incomplete right bundle branch block as well.  Recent Labs: 10/25/2021: BUN 17; Creatinine, Ser 1.15; Hemoglobin 15.7; Platelets 212; Potassium 4.5; Sodium 138  Recent Lipid Panel No results found for: "CHOL", "TRIG", "HDL", "CHOLHDL", "VLDL", "LDLCALC", "LDLDIRECT"   Risk Assessment/Calculations:              Physical Exam:    VS:  BP 136/80 (BP Location: Left Arm, Patient Position: Sitting, Cuff Size: Large)   Pulse 97   Ht 5\' 11"  (1.803 m)   Wt 236 lb (107 kg)   BMI 32.92 kg/m     Wt Readings from Last 3 Encounters:  04/02/22 236 lb (107 kg)  11/02/21 233 lb (105.7 kg)  10/25/21 235 lb 12.8 oz (107 kg)     GEN:  Well nourished, well developed in no acute distress HEENT: Normal NECK: No JVD; No carotid bruits LYMPHATICS: No lymphadenopathy CARDIAC: RRR, no murmurs, no rubs, gallops RESPIRATORY:  Clear to auscultation without rales, wheezing or rhonchi  ABDOMEN: Soft, non-tender, non-distended MUSCULOSKELETAL:  No edema; No deformity  SKIN: Warm and dry NEUROLOGIC:  Alert and oriented x  3 PSYCHIATRIC:  Normal affect   ASSESSMENT:    1. Incomplete right bundle branch block   2. Essential hypertension    PLAN:    In order of problems listed above:  Incomplete right bundle branch block -Benign finding.  Borderline increased QRS duration with right bundle branch block type morphology.  He has not had any high risk symptoms such as syncope chest pain shortness of breath.  Should not be of any major clinical implication in the future.  Prior EKGs reviewed as well and fundamentally they are all very similar.  Reassurance has been given.  No further cardiac testing.  Hypertension - Continue with current medication management strategy which includes Benicar  40 mg.  Hyperlipidemia - On Crestor 10 mg a day excellent.  LDL in October 2022 101.     Medication Adjustments/Labs and Tests Ordered: Current medicines are reviewed at length with the patient today.  Concerns regarding medicines are outlined above.  Orders Placed This Encounter  Procedures   EKG 12-Lead   No orders of the defined types were placed in this encounter.   Patient Instructions  Medication Instructions:  The current medical regimen is effective;  continue present plan and medications.  *If you need a refill on your cardiac medications before your next appointment, please call your pharmacy*  Follow-Up: At [facility], you and your health needs are our priority.  As part of our continuing mission to provide you with exceptional heart care, we have created designated Provider Care Teams.  These Care Teams include your primary Cardiologist (physician) and Advanced Practice Providers (APPs -  Physician Assistants and Nurse Practitioners) who all work together to provide you with the care you need, when you need it.  We recommend signing up for the patient portal called "MyChart".  Sign up information is provided on this After Visit Summary.  MyChart is used to connect with patients for Virtual  Visits (Telemedicine).  Patients are able to view lab/test results, encounter notes, upcoming appointments, etc.  Non-urgent messages can be sent to your provider as well.   To learn more about what you can do with MyChart, go to [email or web address].    Your next appointment:   Follow up as needed with Dr Anne Fu.  Important Information About Sugar         Signed, Donato Schultz, MD  04/02/2022 11:52 AM    [facility]

## 2022-04-02 NOTE — Patient Instructions (Signed)
Medication Instructions:  The current medical regimen is effective;  continue present plan and medications.  *If you need a refill on your cardiac medications before your next appointment, please call your pharmacy*   Follow-Up: At [facility], you and your health needs are our priority.  As part of our continuing mission to provide you with exceptional heart care, we have created designated Provider Care Teams.  These Care Teams include your primary Cardiologist (physician) and Advanced Practice Providers (APPs -  Physician Assistants and Nurse Practitioners) who all work together to provide you with the care you need, when you need it.  We recommend signing up for the patient portal called "MyChart".  Sign up information is provided on this After Visit Summary.  MyChart is used to connect with patients for Virtual Visits (Telemedicine).  Patients are able to view lab/test results, encounter notes, upcoming appointments, etc.  Non-urgent messages can be sent to your provider as well.   To learn more about what you can do with MyChart, go to [email or web address].    Your next appointment:   Follow up as needed with Dr Skains   Important Information About Sugar       

## 2022-05-26 DIAGNOSIS — G4733 Obstructive sleep apnea (adult) (pediatric): Secondary | ICD-10-CM | POA: Diagnosis not present

## 2022-06-09 DIAGNOSIS — G4733 Obstructive sleep apnea (adult) (pediatric): Secondary | ICD-10-CM | POA: Diagnosis not present

## 2022-06-26 DIAGNOSIS — H524 Presbyopia: Secondary | ICD-10-CM | POA: Diagnosis not present

## 2022-07-11 DIAGNOSIS — G4733 Obstructive sleep apnea (adult) (pediatric): Secondary | ICD-10-CM | POA: Diagnosis not present

## 2022-07-26 DIAGNOSIS — H524 Presbyopia: Secondary | ICD-10-CM | POA: Diagnosis not present

## 2022-07-26 DIAGNOSIS — K080 Exfoliation of teeth due to systemic causes: Secondary | ICD-10-CM | POA: Diagnosis not present

## 2022-08-24 DIAGNOSIS — G4730 Sleep apnea, unspecified: Secondary | ICD-10-CM | POA: Diagnosis not present

## 2022-08-24 DIAGNOSIS — E1169 Type 2 diabetes mellitus with other specified complication: Secondary | ICD-10-CM | POA: Diagnosis not present

## 2022-08-24 DIAGNOSIS — E119 Type 2 diabetes mellitus without complications: Secondary | ICD-10-CM | POA: Diagnosis not present

## 2022-08-24 DIAGNOSIS — E291 Testicular hypofunction: Secondary | ICD-10-CM | POA: Diagnosis not present

## 2022-08-24 DIAGNOSIS — E782 Mixed hyperlipidemia: Secondary | ICD-10-CM | POA: Diagnosis not present

## 2023-02-14 DIAGNOSIS — E119 Type 2 diabetes mellitus without complications: Secondary | ICD-10-CM | POA: Diagnosis not present

## 2023-02-14 DIAGNOSIS — M25552 Pain in left hip: Secondary | ICD-10-CM | POA: Diagnosis not present

## 2023-02-19 DIAGNOSIS — K080 Exfoliation of teeth due to systemic causes: Secondary | ICD-10-CM | POA: Diagnosis not present

## 2023-02-27 DIAGNOSIS — S76912D Strain of unspecified muscles, fascia and tendons at thigh level, left thigh, subsequent encounter: Secondary | ICD-10-CM | POA: Diagnosis not present

## 2023-02-27 DIAGNOSIS — Z96642 Presence of left artificial hip joint: Secondary | ICD-10-CM | POA: Diagnosis not present

## 2023-03-05 DIAGNOSIS — S76912D Strain of unspecified muscles, fascia and tendons at thigh level, left thigh, subsequent encounter: Secondary | ICD-10-CM | POA: Diagnosis not present

## 2023-03-05 DIAGNOSIS — Z96642 Presence of left artificial hip joint: Secondary | ICD-10-CM | POA: Diagnosis not present

## 2023-03-07 DIAGNOSIS — Z96642 Presence of left artificial hip joint: Secondary | ICD-10-CM | POA: Diagnosis not present

## 2023-03-07 DIAGNOSIS — S76912D Strain of unspecified muscles, fascia and tendons at thigh level, left thigh, subsequent encounter: Secondary | ICD-10-CM | POA: Diagnosis not present

## 2023-03-12 DIAGNOSIS — S76912D Strain of unspecified muscles, fascia and tendons at thigh level, left thigh, subsequent encounter: Secondary | ICD-10-CM | POA: Diagnosis not present

## 2023-03-12 DIAGNOSIS — Z96642 Presence of left artificial hip joint: Secondary | ICD-10-CM | POA: Diagnosis not present

## 2023-03-14 DIAGNOSIS — S76912D Strain of unspecified muscles, fascia and tendons at thigh level, left thigh, subsequent encounter: Secondary | ICD-10-CM | POA: Diagnosis not present

## 2023-03-14 DIAGNOSIS — Z96642 Presence of left artificial hip joint: Secondary | ICD-10-CM | POA: Diagnosis not present

## 2023-03-17 DIAGNOSIS — E119 Type 2 diabetes mellitus without complications: Secondary | ICD-10-CM | POA: Diagnosis not present

## 2023-03-19 DIAGNOSIS — Z96642 Presence of left artificial hip joint: Secondary | ICD-10-CM | POA: Diagnosis not present

## 2023-03-19 DIAGNOSIS — S76912D Strain of unspecified muscles, fascia and tendons at thigh level, left thigh, subsequent encounter: Secondary | ICD-10-CM | POA: Diagnosis not present

## 2023-03-21 DIAGNOSIS — G4733 Obstructive sleep apnea (adult) (pediatric): Secondary | ICD-10-CM | POA: Diagnosis not present

## 2023-03-22 DIAGNOSIS — Z96642 Presence of left artificial hip joint: Secondary | ICD-10-CM | POA: Diagnosis not present

## 2023-03-22 DIAGNOSIS — S76912D Strain of unspecified muscles, fascia and tendons at thigh level, left thigh, subsequent encounter: Secondary | ICD-10-CM | POA: Diagnosis not present

## 2023-03-26 DIAGNOSIS — Z96642 Presence of left artificial hip joint: Secondary | ICD-10-CM | POA: Diagnosis not present

## 2023-03-26 DIAGNOSIS — S76912D Strain of unspecified muscles, fascia and tendons at thigh level, left thigh, subsequent encounter: Secondary | ICD-10-CM | POA: Diagnosis not present

## 2023-04-02 DIAGNOSIS — I10 Essential (primary) hypertension: Secondary | ICD-10-CM | POA: Diagnosis not present

## 2023-04-02 DIAGNOSIS — U071 COVID-19: Secondary | ICD-10-CM | POA: Diagnosis not present

## 2023-04-02 DIAGNOSIS — Z0000 Encounter for general adult medical examination without abnormal findings: Secondary | ICD-10-CM | POA: Diagnosis not present

## 2023-04-02 DIAGNOSIS — E291 Testicular hypofunction: Secondary | ICD-10-CM | POA: Diagnosis not present

## 2023-04-02 DIAGNOSIS — E782 Mixed hyperlipidemia: Secondary | ICD-10-CM | POA: Diagnosis not present

## 2023-04-02 DIAGNOSIS — E119 Type 2 diabetes mellitus without complications: Secondary | ICD-10-CM | POA: Diagnosis not present

## 2023-04-02 DIAGNOSIS — E1169 Type 2 diabetes mellitus with other specified complication: Secondary | ICD-10-CM | POA: Diagnosis not present

## 2023-04-02 DIAGNOSIS — Z125 Encounter for screening for malignant neoplasm of prostate: Secondary | ICD-10-CM | POA: Diagnosis not present

## 2023-04-09 DIAGNOSIS — S76912D Strain of unspecified muscles, fascia and tendons at thigh level, left thigh, subsequent encounter: Secondary | ICD-10-CM | POA: Diagnosis not present

## 2023-04-09 DIAGNOSIS — Z96642 Presence of left artificial hip joint: Secondary | ICD-10-CM | POA: Diagnosis not present

## 2023-04-11 DIAGNOSIS — S76912D Strain of unspecified muscles, fascia and tendons at thigh level, left thigh, subsequent encounter: Secondary | ICD-10-CM | POA: Diagnosis not present

## 2023-04-11 DIAGNOSIS — Z96642 Presence of left artificial hip joint: Secondary | ICD-10-CM | POA: Diagnosis not present

## 2023-04-16 DIAGNOSIS — E119 Type 2 diabetes mellitus without complications: Secondary | ICD-10-CM | POA: Diagnosis not present

## 2023-04-18 DIAGNOSIS — Z96642 Presence of left artificial hip joint: Secondary | ICD-10-CM | POA: Diagnosis not present

## 2023-04-18 DIAGNOSIS — S76912D Strain of unspecified muscles, fascia and tendons at thigh level, left thigh, subsequent encounter: Secondary | ICD-10-CM | POA: Diagnosis not present

## 2023-04-25 DIAGNOSIS — Z96642 Presence of left artificial hip joint: Secondary | ICD-10-CM | POA: Diagnosis not present

## 2023-04-25 DIAGNOSIS — S76912D Strain of unspecified muscles, fascia and tendons at thigh level, left thigh, subsequent encounter: Secondary | ICD-10-CM | POA: Diagnosis not present

## 2023-04-30 DIAGNOSIS — S76912D Strain of unspecified muscles, fascia and tendons at thigh level, left thigh, subsequent encounter: Secondary | ICD-10-CM | POA: Diagnosis not present

## 2023-04-30 DIAGNOSIS — Z96642 Presence of left artificial hip joint: Secondary | ICD-10-CM | POA: Diagnosis not present

## 2023-05-02 DIAGNOSIS — Z96642 Presence of left artificial hip joint: Secondary | ICD-10-CM | POA: Diagnosis not present

## 2023-05-02 DIAGNOSIS — S76912D Strain of unspecified muscles, fascia and tendons at thigh level, left thigh, subsequent encounter: Secondary | ICD-10-CM | POA: Diagnosis not present

## 2023-05-07 DIAGNOSIS — S76912D Strain of unspecified muscles, fascia and tendons at thigh level, left thigh, subsequent encounter: Secondary | ICD-10-CM | POA: Diagnosis not present

## 2023-05-07 DIAGNOSIS — Z96642 Presence of left artificial hip joint: Secondary | ICD-10-CM | POA: Diagnosis not present

## 2023-05-09 DIAGNOSIS — S76912D Strain of unspecified muscles, fascia and tendons at thigh level, left thigh, subsequent encounter: Secondary | ICD-10-CM | POA: Diagnosis not present

## 2023-05-09 DIAGNOSIS — Z96642 Presence of left artificial hip joint: Secondary | ICD-10-CM | POA: Diagnosis not present

## 2023-05-14 DIAGNOSIS — S76912D Strain of unspecified muscles, fascia and tendons at thigh level, left thigh, subsequent encounter: Secondary | ICD-10-CM | POA: Diagnosis not present

## 2023-05-14 DIAGNOSIS — Z96642 Presence of left artificial hip joint: Secondary | ICD-10-CM | POA: Diagnosis not present

## 2023-05-16 DIAGNOSIS — S76912D Strain of unspecified muscles, fascia and tendons at thigh level, left thigh, subsequent encounter: Secondary | ICD-10-CM | POA: Diagnosis not present

## 2023-05-16 DIAGNOSIS — Z96642 Presence of left artificial hip joint: Secondary | ICD-10-CM | POA: Diagnosis not present

## 2023-05-17 DIAGNOSIS — E119 Type 2 diabetes mellitus without complications: Secondary | ICD-10-CM | POA: Diagnosis not present

## 2023-05-28 DIAGNOSIS — Z794 Long term (current) use of insulin: Secondary | ICD-10-CM | POA: Diagnosis not present

## 2023-06-16 DIAGNOSIS — E119 Type 2 diabetes mellitus without complications: Secondary | ICD-10-CM | POA: Diagnosis not present

## 2023-06-20 DIAGNOSIS — M5442 Lumbago with sciatica, left side: Secondary | ICD-10-CM | POA: Diagnosis not present

## 2023-06-27 DIAGNOSIS — M545 Low back pain, unspecified: Secondary | ICD-10-CM | POA: Diagnosis not present

## 2023-07-17 DIAGNOSIS — E119 Type 2 diabetes mellitus without complications: Secondary | ICD-10-CM | POA: Diagnosis not present

## 2023-07-18 DIAGNOSIS — M545 Low back pain, unspecified: Secondary | ICD-10-CM | POA: Diagnosis not present

## 2023-07-22 DIAGNOSIS — M5416 Radiculopathy, lumbar region: Secondary | ICD-10-CM | POA: Diagnosis not present

## 2023-07-24 DIAGNOSIS — M5416 Radiculopathy, lumbar region: Secondary | ICD-10-CM | POA: Diagnosis not present

## 2023-07-30 DIAGNOSIS — M5416 Radiculopathy, lumbar region: Secondary | ICD-10-CM | POA: Diagnosis not present

## 2023-08-02 DIAGNOSIS — M5416 Radiculopathy, lumbar region: Secondary | ICD-10-CM | POA: Diagnosis not present

## 2023-08-05 DIAGNOSIS — M5416 Radiculopathy, lumbar region: Secondary | ICD-10-CM | POA: Diagnosis not present

## 2023-08-08 DIAGNOSIS — M5416 Radiculopathy, lumbar region: Secondary | ICD-10-CM | POA: Diagnosis not present

## 2023-08-13 DIAGNOSIS — M5416 Radiculopathy, lumbar region: Secondary | ICD-10-CM | POA: Diagnosis not present

## 2023-08-15 DIAGNOSIS — M5416 Radiculopathy, lumbar region: Secondary | ICD-10-CM | POA: Diagnosis not present

## 2023-08-17 DIAGNOSIS — E119 Type 2 diabetes mellitus without complications: Secondary | ICD-10-CM | POA: Diagnosis not present

## 2023-08-20 DIAGNOSIS — M48062 Spinal stenosis, lumbar region with neurogenic claudication: Secondary | ICD-10-CM | POA: Diagnosis not present

## 2023-09-09 DIAGNOSIS — K080 Exfoliation of teeth due to systemic causes: Secondary | ICD-10-CM | POA: Diagnosis not present

## 2023-09-11 DIAGNOSIS — E119 Type 2 diabetes mellitus without complications: Secondary | ICD-10-CM | POA: Diagnosis not present

## 2023-09-11 DIAGNOSIS — H5203 Hypermetropia, bilateral: Secondary | ICD-10-CM | POA: Diagnosis not present

## 2023-09-12 DIAGNOSIS — M48062 Spinal stenosis, lumbar region with neurogenic claudication: Secondary | ICD-10-CM | POA: Diagnosis not present

## 2023-09-14 DIAGNOSIS — E119 Type 2 diabetes mellitus without complications: Secondary | ICD-10-CM | POA: Diagnosis not present

## 2023-09-19 DIAGNOSIS — H524 Presbyopia: Secondary | ICD-10-CM | POA: Diagnosis not present

## 2023-10-04 DIAGNOSIS — M48062 Spinal stenosis, lumbar region with neurogenic claudication: Secondary | ICD-10-CM | POA: Diagnosis not present

## 2023-10-11 DIAGNOSIS — E291 Testicular hypofunction: Secondary | ICD-10-CM | POA: Diagnosis not present

## 2023-10-11 DIAGNOSIS — E782 Mixed hyperlipidemia: Secondary | ICD-10-CM | POA: Diagnosis not present

## 2023-10-11 DIAGNOSIS — D72819 Decreased white blood cell count, unspecified: Secondary | ICD-10-CM | POA: Diagnosis not present

## 2023-10-11 DIAGNOSIS — E1169 Type 2 diabetes mellitus with other specified complication: Secondary | ICD-10-CM | POA: Diagnosis not present

## 2023-10-11 DIAGNOSIS — I10 Essential (primary) hypertension: Secondary | ICD-10-CM | POA: Diagnosis not present

## 2023-10-15 DIAGNOSIS — E119 Type 2 diabetes mellitus without complications: Secondary | ICD-10-CM | POA: Diagnosis not present

## 2023-11-14 DIAGNOSIS — E119 Type 2 diabetes mellitus without complications: Secondary | ICD-10-CM | POA: Diagnosis not present

## 2023-11-18 DIAGNOSIS — M48062 Spinal stenosis, lumbar region with neurogenic claudication: Secondary | ICD-10-CM | POA: Diagnosis not present

## 2023-11-18 DIAGNOSIS — M47816 Spondylosis without myelopathy or radiculopathy, lumbar region: Secondary | ICD-10-CM | POA: Diagnosis not present

## 2023-11-25 DIAGNOSIS — H401131 Primary open-angle glaucoma, bilateral, mild stage: Secondary | ICD-10-CM | POA: Diagnosis not present

## 2023-11-25 DIAGNOSIS — H40213 Acute angle-closure glaucoma, bilateral: Secondary | ICD-10-CM | POA: Diagnosis not present

## 2023-11-25 DIAGNOSIS — H40033 Anatomical narrow angle, bilateral: Secondary | ICD-10-CM | POA: Diagnosis not present

## 2023-12-04 DIAGNOSIS — M47816 Spondylosis without myelopathy or radiculopathy, lumbar region: Secondary | ICD-10-CM | POA: Diagnosis not present

## 2023-12-12 DIAGNOSIS — H401131 Primary open-angle glaucoma, bilateral, mild stage: Secondary | ICD-10-CM | POA: Diagnosis not present

## 2023-12-15 DIAGNOSIS — E119 Type 2 diabetes mellitus without complications: Secondary | ICD-10-CM | POA: Diagnosis not present

## 2023-12-30 DIAGNOSIS — M48062 Spinal stenosis, lumbar region with neurogenic claudication: Secondary | ICD-10-CM | POA: Diagnosis not present

## 2024-01-14 DIAGNOSIS — E119 Type 2 diabetes mellitus without complications: Secondary | ICD-10-CM | POA: Diagnosis not present

## 2024-01-15 DIAGNOSIS — M48062 Spinal stenosis, lumbar region with neurogenic claudication: Secondary | ICD-10-CM | POA: Diagnosis not present

## 2024-02-14 DIAGNOSIS — E119 Type 2 diabetes mellitus without complications: Secondary | ICD-10-CM | POA: Diagnosis not present

## 2024-02-14 DIAGNOSIS — M48062 Spinal stenosis, lumbar region with neurogenic claudication: Secondary | ICD-10-CM | POA: Diagnosis not present

## 2024-03-16 DIAGNOSIS — E119 Type 2 diabetes mellitus without complications: Secondary | ICD-10-CM | POA: Diagnosis not present

## 2024-03-17 DIAGNOSIS — K080 Exfoliation of teeth due to systemic causes: Secondary | ICD-10-CM | POA: Diagnosis not present

## 2024-04-15 DIAGNOSIS — E119 Type 2 diabetes mellitus without complications: Secondary | ICD-10-CM | POA: Diagnosis not present

## 2024-05-14 DIAGNOSIS — M48062 Spinal stenosis, lumbar region with neurogenic claudication: Secondary | ICD-10-CM | POA: Diagnosis not present

## 2024-05-15 DIAGNOSIS — Z23 Encounter for immunization: Secondary | ICD-10-CM | POA: Diagnosis not present

## 2024-05-15 DIAGNOSIS — I10 Essential (primary) hypertension: Secondary | ICD-10-CM | POA: Diagnosis not present

## 2024-05-15 DIAGNOSIS — E782 Mixed hyperlipidemia: Secondary | ICD-10-CM | POA: Diagnosis not present

## 2024-05-15 DIAGNOSIS — Z1331 Encounter for screening for depression: Secondary | ICD-10-CM | POA: Diagnosis not present

## 2024-05-15 DIAGNOSIS — Z125 Encounter for screening for malignant neoplasm of prostate: Secondary | ICD-10-CM | POA: Diagnosis not present

## 2024-05-15 DIAGNOSIS — E1169 Type 2 diabetes mellitus with other specified complication: Secondary | ICD-10-CM | POA: Diagnosis not present

## 2024-05-15 DIAGNOSIS — Z0000 Encounter for general adult medical examination without abnormal findings: Secondary | ICD-10-CM | POA: Diagnosis not present

## 2024-05-15 DIAGNOSIS — E1139 Type 2 diabetes mellitus with other diabetic ophthalmic complication: Secondary | ICD-10-CM | POA: Diagnosis not present

## 2024-05-15 DIAGNOSIS — E291 Testicular hypofunction: Secondary | ICD-10-CM | POA: Diagnosis not present

## 2024-05-16 DIAGNOSIS — E119 Type 2 diabetes mellitus without complications: Secondary | ICD-10-CM | POA: Diagnosis not present

## 2024-06-08 DIAGNOSIS — M48062 Spinal stenosis, lumbar region with neurogenic claudication: Secondary | ICD-10-CM | POA: Diagnosis not present
